# Patient Record
Sex: Male | Born: 1995 | Race: White | Hispanic: No | Marital: Married | Smoking: Current every day smoker
Health system: Southern US, Community
[De-identification: ages and names within clinical notes are randomized; demographics above are authoritative.]

## PROBLEM LIST (undated history)

## (undated) DIAGNOSIS — S43439A Superior glenoid labrum lesion of unspecified shoulder, initial encounter: Secondary | ICD-10-CM

## (undated) HISTORY — PX: WISDOM TOOTH EXTRACTION: SHX21

---
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
## 2020-10-08 ENCOUNTER — Emergency Department

## 2020-10-08 ENCOUNTER — Other Ambulatory Visit: Payer: Self-pay

## 2020-10-08 ENCOUNTER — Emergency Department
Admission: EM | Admit: 2020-10-08 | Discharge: 2020-10-08 | Disposition: A | Discharge status: Home / Self Care | Attending: Emergency Medicine | Admitting: Emergency Medicine

## 2020-10-08 DIAGNOSIS — Z20822 Contact with and (suspected) exposure to covid-19: Secondary | ICD-10-CM | POA: Insufficient documentation

## 2020-10-08 DIAGNOSIS — N50811 Right testicular pain: Secondary | ICD-10-CM

## 2020-10-08 DIAGNOSIS — R109 Unspecified abdominal pain: Secondary | ICD-10-CM | POA: Diagnosis not present

## 2020-10-08 DIAGNOSIS — N5082 Scrotal pain: Secondary | ICD-10-CM | POA: Diagnosis not present

## 2020-10-08 LAB — URINALYSIS, ROUTINE W REFLEX MICROSCOPIC
Bilirubin Urine: NEGATIVE
Glucose, UA: NEGATIVE mg/dL
Hgb urine dipstick: NEGATIVE
Ketones, ur: NEGATIVE mg/dL
Leukocytes,Ua: NEGATIVE
Nitrite: NEGATIVE
Protein, ur: NEGATIVE mg/dL
Specific Gravity, Urine: 1.017 (ref 1.005–1.030)
pH: 7 (ref 5.0–8.0)

## 2020-10-08 LAB — CBC
HCT: 46.1 % (ref 39.0–52.0)
Hemoglobin: 15.6 g/dL (ref 13.0–17.0)
MCH: 29.2 pg (ref 26.0–34.0)
MCHC: 33.8 g/dL (ref 30.0–36.0)
MCV: 86.2 fL (ref 80.0–100.0)
Platelets: 258 10*3/uL (ref 150–400)
RBC: 5.35 MIL/uL (ref 4.22–5.81)
RDW: 12.7 % (ref 11.5–15.5)
WBC: 9.2 10*3/uL (ref 4.0–10.5)
nRBC: 0 % (ref 0.0–0.2)

## 2020-10-08 LAB — COMPREHENSIVE METABOLIC PANEL
ALT: 35 U/L (ref 0–44)
AST: 23 U/L (ref 15–41)
Albumin: 5.1 g/dL — ABNORMAL HIGH (ref 3.5–5.0)
Alkaline Phosphatase: 55 U/L (ref 38–126)
Anion gap: 9 (ref 5–15)
BUN: 10 mg/dL (ref 6–20)
CO2: 28 mmol/L (ref 22–32)
Calcium: 10.4 mg/dL — ABNORMAL HIGH (ref 8.9–10.3)
Chloride: 101 mmol/L (ref 98–111)
Creatinine, Ser: 0.92 mg/dL (ref 0.61–1.24)
GFR, Estimated: >60 mL/min (ref >60)
Glucose, Bld: 94 mg/dL (ref 70–99)
Potassium: 4.1 mmol/L (ref 3.5–5.1)
Sodium: 138 mmol/L (ref 135–145)
Total Bilirubin: 1.2 mg/dL (ref 0.3–1.2)
Total Protein: 7.7 g/dL (ref 6.5–8.1)

## 2020-10-08 LAB — CHLAMYDIA/NGC RT PCR (ARMC ONLY)
Chlamydia Tr: NOT DETECTED
N gonorrhoeae: NOT DETECTED

## 2020-10-08 LAB — RESP PANEL BY RT-PCR (FLU A&B, COVID) ARPGX2
Influenza A by PCR: NEGATIVE
Influenza B by PCR: NEGATIVE
SARS Coronavirus 2 by RT PCR: NEGATIVE

## 2020-10-08 MED ORDER — DOXYCYCLINE MONOHYDRATE 100 MG PO TABS: 100.0000 mg | ORAL_TABLET | Freq: Two times a day (BID) | ORAL | 0 refills | Status: AC

## 2020-10-08 MED ORDER — KETOROLAC TROMETHAMINE 30 MG/ML IJ SOLN
30.0000 mg | Freq: Once | INTRAMUSCULAR | Status: AC
  Administered 2020-10-08: 30 mg via INTRAVENOUS
  Filled 2020-10-08: qty 1

## 2020-10-08 MED ORDER — MORPHINE SULFATE (PF) 4 MG/ML IV SOLN
4.0000 mg | Freq: Once | INTRAVENOUS | Status: AC
  Administered 2020-10-08: 4 mg via INTRAVENOUS
  Filled 2020-10-08: qty 1

## 2020-10-08 MED ORDER — ONDANSETRON HCL 4 MG/2ML IJ SOLN
4.0000 mg | Freq: Once | INTRAMUSCULAR | Status: AC
  Administered 2020-10-08: 4 mg via INTRAVENOUS
  Filled 2020-10-08: qty 2

## 2020-10-08 MED ORDER — KETOROLAC TROMETHAMINE 10 MG PO TABS: 10.0000 mg | ORAL_TABLET | Freq: Four times a day (QID) | ORAL | 0 refills | Status: AC | PRN

## 2020-10-08 MED ORDER — DOXYCYCLINE MONOHYDRATE 100 MG PO TABS: 100.0000 mg | ORAL_TABLET | Freq: Two times a day (BID) | ORAL | 0 refills | Status: DC

## 2020-10-08 MED ORDER — IOHEXOL 350 MG/ML SOLN
80.0000 mL | Freq: Once | INTRAVENOUS | Status: AC | PRN
  Administered 2020-10-08: 80 mL via INTRAVENOUS
  Filled 2020-10-08: qty 80

## 2020-10-08 MED ORDER — CEFTRIAXONE SODIUM 1 G IJ SOLR
500.0000 mg | Freq: Once | INTRAMUSCULAR | Status: AC
  Administered 2020-10-08: 500 mg via INTRAMUSCULAR
  Filled 2020-10-08: qty 10

## 2020-10-08 MED ORDER — SODIUM CHLORIDE 0.9 % IV BOLUS
500.0000 mL | Freq: Once | INTRAVENOUS | Status: AC
Start: 2020-10-08 — End: 2020-10-08
  Administered 2020-10-08: 500 mL via INTRAVENOUS

## 2020-10-08 MED ORDER — LIDOCAINE HCL (PF) 1 % IJ SOLN
INTRAMUSCULAR | Status: AC
  Filled 2020-10-08: qty 5

## 2020-10-08 NOTE — ED Notes (Signed)
Patient transported to Ultrasound 

## 2020-10-08 NOTE — ED Triage Notes (Signed)
Pt presets to ED with c/o of R sided testicle pain that started yesterday at noon. Pt denies trauma or injury to this area. Pt denies HX of kidney stones or pain starting in flank area. Pt denies burning on urination. Pt states "it feels swollen on the inside".

## 2020-10-08 NOTE — Discharge Instructions (Addendum)
Take Doxycyline twice daily for ten days.  Wear loose clothing at home. You can take Toradol up to 4 times daily for the next 5 days. Please take doxycycline twice daily for the next 10 days. If pain does not progressively improve in 1 week, please make follow-up appointment with urology.

## 2020-10-08 NOTE — ED Provider Notes (Signed)
[facility]-EMERGENCY DEPARTMENT  ____________________________________________  Time seen: Approximately 4:16 PM  I have reviewed the triage vital signs and the nursing notes.   HISTORY  Chief Complaint Testicle Pain   Historian Patient     HPI Alexander Gallagher is a 25 y.o. male presents to the emergency department with right-sided scrotal pain and swelling that started acutely.  Patient reports that he was at work and was lifting heavy bags when he noted a pulling sensation in his groin.  Patient states that he did have a similar tearing sensation approximately 1 year ago when he was moving to pick up his child from the couch but states that he did not have swelling or pain at that time.  He denies nausea or vomiting.  He states that his urine has appeared cloudy to him which is atypical but denies dysuria, hematuria or low back pain.  He denies falls or mechanisms of trauma.  No prior history of torsion or scrotal abnormalities.  He does endorse some suprapubic discomfort.  No chest pain, chest tightness or shortness of breath.   History reviewed. No pertinent past medical history.   Immunizations up to date:  Yes.     History reviewed. No pertinent past medical history.  There are no problems to display for this patient.   History reviewed. No pertinent surgical history.  Prior to Admission medications   Medication Sig Start Date End Date Taking? Authorizing Provider  ketorolac (TORADOL) 10 MG tablet Take 1 tablet (10 mg total) by mouth every 6 (six) hours as needed for up to 5 days. 10/08/20 10/13/20 Yes Pia Mau M, PA-C  doxycycline (ADOXA) 100 MG tablet Take 1 tablet (100 mg total) by mouth 2 (two) times daily for 10 days. 10/08/20 10/18/20  Orvil Feil, PA-C    Allergies Patient has no known allergies.  History reviewed. No pertinent family history.  Social History     Review of Systems  Constitutional: No fever/chills Eyes:  No discharge ENT: No upper  respiratory complaints. Respiratory: no cough. No SOB/ use of accessory muscles to breath Gastrointestinal:   No nausea, no vomiting.  No diarrhea.  No constipation. Genitourinary: Patient has scrotal pain.  Musculoskeletal: Negative for musculoskeletal pain. Skin: Negative for rash, abrasions, lacerations, ecchymosis.    ____________________________________________   PHYSICAL EXAM:  VITAL SIGNS: ED Triage Vitals  Enc Vitals Group     BP 10/08/20 1539 133/75     Pulse Rate 10/08/20 1539 87     Resp 10/08/20 1539 16     Temp 10/08/20 1539 98.5 F (36.9 C)     Temp Source 10/08/20 1539 Oral     SpO2 10/08/20 1539 100 %     Weight 10/08/20 1540 198 lb (89.8 kg)     Height 10/08/20 1540 5\' 11"  (1.803 m)     Head Circumference --      Peak Flow --      Pain Score 10/08/20 1540 10     Pain Loc --      Pain Edu? --      Excl. in GC? --      Constitutional: Alert and oriented. Well appearing and in no acute distress. Eyes: Conjunctivae are normal. PERRL. EOMI. Head: Atraumatic. ENT:       Nose: No congestion/rhinnorhea.      Mouth/Throat: Mucous membranes are moist.  Neck: No stridor.  No cervical spine tenderness to palpation.  Cardiovascular: Normal rate, regular rhythm. Normal S1 and S2.  Good peripheral circulation.  Respiratory: Normal respiratory effort without tachypnea or retractions. Lungs CTAB. Good air entry to the bases with no decreased or absent breath sounds Gastrointestinal: Bowel sounds x 4 quadrants. Soft and nontender to palpation. No guarding or rigidity. No distention. Musculoskeletal: Full range of motion to all extremities. No obvious deformities noted Neurologic:  Normal for age. No gross focal neurologic deficits are appreciated.  Skin:  Skin is warm, dry and intact. No rash noted. Psychiatric: Mood and affect are normal for age. Speech and behavior are normal.   ____________________________________________   LABS (all labs ordered are listed, but  only abnormal results are displayed)  Labs Reviewed  URINALYSIS, ROUTINE W REFLEX MICROSCOPIC - Abnormal; Notable for the following components:      Result Value   Color, Urine YELLOW (*)    APPearance CLOUDY (*)    All other components within normal limits  COMPREHENSIVE METABOLIC PANEL - Abnormal; Notable for the following components:   Calcium 10.4 (*)    Albumin 5.1 (*)    All other components within normal limits  CHLAMYDIA/NGC RT PCR ([facility] ONLY)            RESP PANEL BY RT-PCR (FLU A&B, COVID) ARPGX2  CBC   ____________________________________________  EKG   ____________________________________________  RADIOLOGY Geraldo Pitter, personally viewed and evaluated these images (plain radiographs) as part of my medical decision making, as well as reviewing the written report by the radiologist.    CT ABDOMEN PELVIS W CONTRAST  Result Date: 10/08/2020 CLINICAL DATA:  Right-sided testicular pain. EXAM: CT ABDOMEN AND PELVIS WITH CONTRAST TECHNIQUE: Multidetector CT imaging of the abdomen and pelvis was performed using the standard protocol following bolus administration of intravenous contrast. CONTRAST:  41mL OMNIPAQUE IOHEXOL 350 MG/ML SOLN COMPARISON:  None. FINDINGS: Lower chest: No acute abnormality. Hepatobiliary: No focal liver abnormality is seen. No gallstones, gallbladder wall thickening, or biliary dilatation. Pancreas: Unremarkable. No pancreatic ductal dilatation or surrounding inflammatory changes. Spleen: Normal in size without focal abnormality. Adrenals/Urinary Tract: Adrenal glands are unremarkable. Kidneys are normal, without renal calculi, focal lesion, or hydronephrosis. Bladder is unremarkable. Stomach/Bowel: Stomach is within normal limits. Appendix appears normal. No evidence of bowel wall thickening, distention, or inflammatory changes. Vascular/Lymphatic: No significant vascular findings are present. No enlarged abdominal or pelvic lymph nodes. Reproductive:  Prostate is unremarkable. Other: No abdominal wall hernia or abnormality. No abdominopelvic ascites. Musculoskeletal: No acute or significant osseous findings. IMPRESSION: No CT evidence of acute intra-abdominal pathology. Electronically Signed   By: Aram Candela M.D.   On: 10/08/2020 17:33   US SCROTUM W/DOPPLER  Result Date: 10/08/2020 CLINICAL DATA:  Right testicle pain since yesterday. EXAM: SCROTAL ULTRASOUND DOPPLER ULTRASOUND OF THE TESTICLES TECHNIQUE: Complete ultrasound examination of the testicles, epididymis, and other scrotal structures was performed. Color and spectral Doppler ultrasound were also utilized to evaluate blood flow to the testicles. COMPARISON:  None. FINDINGS: Right testicle Measurements: 5.2 x 2.3 x 2.9 cm. No mass or microlithiasis visualized. Left testicle Measurements: 4.8 x 1 x 2 cm. No mass or microlithiasis visualized. Right epididymis:  Normal in size and appearance. Left epididymis:  Normal in size and appearance. Hydrocele:  None visualized. Varicocele:  None visualized. Pulsed Doppler interrogation of both testes demonstrates normal low resistance arterial and venous waveforms bilaterally. IMPRESSION: Unremarkable bilateral scrotal ultrasound. Electronically Signed   By: Tish Frederickson M.D.   On: 10/08/2020 17:08    ____________________________________________    PROCEDURES  Procedure(s) performed:     Procedures  Medications  morphine 4 MG/ML injection 4 mg (4 mg Intravenous Given 10/08/20 1622)  ondansetron (ZOFRAN) injection 4 mg (4 mg Intravenous Given 10/08/20 1621)  sodium chloride 0.9 % bolus 500 mL (0 mLs Intravenous Stopped 10/08/20 1854)  iohexol (OMNIPAQUE) 350 MG/ML injection 80 mL (80 mLs Intravenous Contrast Given 10/08/20 1654)  ketorolac (TORADOL) 30 MG/ML injection 30 mg (30 mg Intravenous Given 10/08/20 1846)  cefTRIAXone (ROCEPHIN) injection 500 mg (500 mg Intramuscular Given 10/08/20 1855)  lidocaine (PF) (XYLOCAINE) 1 %  injection (  Given 10/08/20 1855)     ____________________________________________   INITIAL IMPRESSION / ASSESSMENT AND PLAN / ED COURSE  Pertinent labs & imaging results that were available during my care of the patient were reviewed by me and considered in my medical decision making (see chart for details).      Assessment and plan Scrotal pain 25 year old male presents to the emergency department with acute right-sided scrotal pain and swelling without overlying erythema or nausea and vomiting that started today after an episode of heavy lifting.  Vital signs were reassuring at triage.  On physical exam, patient seemed to be in intense pain.  He reported that scrotal pain was worse with ambulation and relieved with rest.  Differential diagnosis at this time includes hernia, testicular torsion, scrotal abscess, epididymitis, hydrocele....  Will obtain scrotal ultrasound and dedicated CT abdomen pelvis along with basic labs and urinalysis and will reassess...  Morphine and supplemental fluids were given in the emergency department for pain  Scrotal ultrasound showed no evidence of torsion or scrotal abscess.  No evidence of incarcerated hernia on CT abdomen and pelvis or other acute intra-abdominal abnormality.  We cover patient for early epididymitis with Rocephin and doxycycline and will give injection of Toradol for pain and oral Toradol at home.  Patient was advised to follow-up with urology if symptoms persist.  All patient questions were answered.     ____________________________________________  FINAL CLINICAL IMPRESSION(S) / ED DIAGNOSES  Final diagnoses:  Scrotal pain      NEW MEDICATIONS STARTED DURING THIS VISIT:  ED Discharge Orders          Ordered    doxycycline (ADOXA) 100 MG tablet  2 times daily,   Status:  Discontinued        10/08/20 1810    ketorolac (TORADOL) 10 MG tablet  Every 6 hours PRN        10/08/20 1810    doxycycline (ADOXA) 100 MG  tablet  2 times daily        10/08/20 1811                This chart was dictated using voice recognition software/Dragon. Despite best efforts to proofread, errors can occur which can change the meaning. Any change was purely unintentional.     Orvil Feil, PA-C 10/08/20 1948    Delton [place], MD 10/25/20 [phone]

## 2020-10-10 ENCOUNTER — Other Ambulatory Visit: Payer: Self-pay

## 2020-10-10 ENCOUNTER — Emergency Department
Admission: EM | Admit: 2020-10-10 | Discharge: 2020-10-10 | Disposition: A | Discharge status: Home / Self Care | Attending: Emergency Medicine | Admitting: Emergency Medicine

## 2020-10-10 ENCOUNTER — Emergency Department

## 2020-10-10 DIAGNOSIS — N453 Epididymo-orchitis: Secondary | ICD-10-CM | POA: Diagnosis not present

## 2020-10-10 DIAGNOSIS — N50811 Right testicular pain: Secondary | ICD-10-CM | POA: Diagnosis present

## 2020-10-10 LAB — CHLAMYDIA/NGC RT PCR (ARMC ONLY)
Chlamydia Tr: NOT DETECTED
N gonorrhoeae: NOT DETECTED

## 2020-10-10 LAB — BASIC METABOLIC PANEL
Anion gap: 10 (ref 5–15)
BUN: 15 mg/dL (ref 6–20)
CO2: 26 mmol/L (ref 22–32)
Calcium: 9.7 mg/dL (ref 8.9–10.3)
Chloride: 103 mmol/L (ref 98–111)
Creatinine, Ser: 1.26 mg/dL — ABNORMAL HIGH (ref 0.61–1.24)
GFR, Estimated: >60 mL/min (ref >60)
Glucose, Bld: 100 mg/dL — ABNORMAL HIGH (ref 70–99)
Potassium: 4.2 mmol/L (ref 3.5–5.1)
Sodium: 139 mmol/L (ref 135–145)

## 2020-10-10 LAB — CBC
HCT: 43.5 % (ref 39.0–52.0)
Hemoglobin: 15 g/dL (ref 13.0–17.0)
MCH: 29.9 pg (ref 26.0–34.0)
MCHC: 34.5 g/dL (ref 30.0–36.0)
MCV: 86.7 fL (ref 80.0–100.0)
Platelets: 225 10*3/uL (ref 150–400)
RBC: 5.02 MIL/uL (ref 4.22–5.81)
RDW: 12.6 % (ref 11.5–15.5)
WBC: 7.7 10*3/uL (ref 4.0–10.5)
nRBC: 0 % (ref 0.0–0.2)

## 2020-10-10 LAB — URINALYSIS, ROUTINE W REFLEX MICROSCOPIC
Bilirubin Urine: NEGATIVE
Glucose, UA: NEGATIVE mg/dL
Hgb urine dipstick: NEGATIVE
Ketones, ur: 5 mg/dL — AB
Leukocytes,Ua: NEGATIVE
Nitrite: NEGATIVE
Protein, ur: NEGATIVE mg/dL
Specific Gravity, Urine: 1.019 (ref 1.005–1.030)
pH: 6 (ref 5.0–8.0)

## 2020-10-10 MED ORDER — OXYCODONE HCL 5 MG PO TABS
5.0000 mg | ORAL_TABLET | Freq: Once | ORAL | Status: AC
Start: 2020-10-10 — End: 2020-10-10
  Administered 2020-10-10: 5 mg via ORAL
  Filled 2020-10-10: qty 1

## 2020-10-10 MED ORDER — KETOROLAC TROMETHAMINE 30 MG/ML IJ SOLN
15.0000 mg | Freq: Once | INTRAMUSCULAR | Status: AC
  Administered 2020-10-10: 15 mg via INTRAVENOUS
  Filled 2020-10-10: qty 1

## 2020-10-10 MED ORDER — ONDANSETRON HCL 4 MG/2ML IJ SOLN
4.0000 mg | Freq: Once | INTRAMUSCULAR | Status: AC
  Administered 2020-10-10: 4 mg via INTRAVENOUS
  Filled 2020-10-10: qty 2

## 2020-10-10 MED ORDER — OXYCODONE HCL 5 MG PO TABS: 5.0000 mg | ORAL_TABLET | Freq: Four times a day (QID) | ORAL | 0 refills | Status: AC | PRN

## 2020-10-10 MED ORDER — HYDROMORPHONE HCL 1 MG/ML IJ SOLN
0.5000 mg | Freq: Once | INTRAMUSCULAR | Status: AC
  Administered 2020-10-10: 0.5 mg via INTRAVENOUS
  Filled 2020-10-10: qty 1

## 2020-10-10 MED ORDER — SODIUM CHLORIDE 0.9 % IV BOLUS
1000.0000 mL | Freq: Once | INTRAVENOUS | Status: AC
  Administered 2020-10-10: 1000 mL via INTRAVENOUS

## 2020-10-10 NOTE — Discharge Instructions (Addendum)
I talked to urology Dr. Apolinar Junes and hopefully one of their partners can see you tomorrow or she can see you on Tuesday.  Take Tylenol 1 g every 8 hours, the Toradol as prescribed and the oxycodone for breakthrough pain.  You can take this every 4-6 hours to help with pain.  Do not drive or work while on this.  Urology is going to try to set you up with an appointment either tomorrow or on Tuesday.  If you not hear from them tomorrow you can call them.  Return to the ER for fevers, redness, changes in the skin color or any other concerns   IMPRESSION: 1. Heterogeneous, hypervascular right testicle and epididymis with a small right hydrocele, findings consistent with right epididymo-orchitis. 2. The left testicle is normal in appearance.  Take oxycodone as prescribed. Do not drink alcohol, drive or participate in any other potentially dangerous activities while taking this medication as it may make you sleepy. Do not take this medication with any other sedating medications, either prescription or over-the-counter. If you were prescribed Percocet or Vicodin, do not take these with acetaminophen (Tylenol) as it is already contained within these medications.  This medication is an opiate (or narcotic) pain medication and can be habit forming. Use it as little as possible to achieve adequate pain control. Do not use or use it with extreme caution if you have a history of opiate abuse or dependence. If you are on a pain contract with your primary care doctor or a pain specialist, be sure to let them know you were prescribed this medication today from the Emergency Department. This medication is intended for your use only - do not give any to anyone else and keep it in a secure place where nobody else, especially children, have access to it.

## 2020-10-10 NOTE — ED Notes (Signed)
Patient given discharge instructions, all questions answered. Patient in possession of all belongings, directed to the discharge area  

## 2020-10-10 NOTE — ED Triage Notes (Signed)
Pt comes pov with testicle pain. Started antibiotics and pain meds Friday but states they aren't helping and his right testicle is now much larger. Had [place] and bloodwork done Friday.

## 2020-10-10 NOTE — ED Provider Notes (Signed)
[facility] Emergency Department Provider Note  ____________________________________________   Event Date/Time   First MD Initiated Contact with Patient 10/10/20 1145     (approximate)  I have reviewed the triage vital signs and the nursing notes.   HISTORY  Chief Complaint Testicle Pain    HPI Alexander Gallagher is a 25 y.o. male who is otherwise healthy who comes in with concerns for testicle pain.  Patient reports that he was started on antibiotics and pain meds on Friday.  He has taken a total of 4 doses of the doxycycline and denies any improvement with the Toradol at home.  He states that initially there was no swelling or redness of the testicles when he came in on Friday.  He states that on Thursday he felt like he pulled something in his groin and as if he had torn something.  However Saturday he started developing swelling in the right testicle that has been gradually getting worse and now having more significant pain that is severe, constant, radiating into his lower abdomen and into his back.  Denies this ever happening previously.  Patient is sexually active with women only.  He is married.    On review of records patient was seen on Friday.  At that time his urine was negative for UTI and he had a CT scan done that was negative and ultrasound that was negative.  Patient was covered for epididymitis with Rocephin and doxycycline and recommend to follow-up with urology.   History reviewed. No pertinent past medical history.  There are no problems to display for this patient.   History reviewed. No pertinent surgical history.  Prior to Admission medications   Medication Sig Start Date End Date Taking? Authorizing Provider  doxycycline (ADOXA) 100 MG tablet Take 1 tablet (100 mg total) by mouth 2 (two) times daily for 10 days. 10/08/20 10/18/20  Orvil Feil, PA-C  ketorolac (TORADOL) 10 MG tablet Take 1 tablet (10 mg total) by mouth every 6 (six)  hours as needed for up to 5 days. 10/08/20 10/13/20  Orvil Feil, PA-C    Allergies Patient has no known allergies.  History reviewed. No pertinent family history.  Social History Married, no drugs    Review of Systems Constitutional: No fever/chills Eyes: No visual changes. ENT: No sore throat. Cardiovascular: Denies chest pain. Respiratory: Denies shortness of breath. Gastrointestinal: Abdominal pain no nausea, no vomiting.  No diarrhea.  No constipation. Genitourinary: Testicle pain Musculoskeletal: Back pain Skin: Negative for rash. Neurological: Negative for headaches, focal weakness or numbness. All other ROS negative ____________________________________________   PHYSICAL EXAM:  VITAL SIGNS: ED Triage Vitals [10/10/20 1113]  Enc Vitals Group     BP 128/74     Pulse Rate 97     Resp 18     Temp 99.6 F (37.6 C)     Temp Source Oral     SpO2 98 %     Weight 196 lb 3.4 oz (89 kg)     Height 5\' 11"  (1.803 m)     Head Circumference      Peak Flow      Pain Score 8     Pain Loc      Pain Edu?      Excl. in GC?     Constitutional: Alert and oriented. Well appearing and in no acute distress. Eyes: Conjunctivae are normal. EOMI. Head: Atraumatic. Nose: No congestion/rhinnorhea. Mouth/Throat: Mucous membranes are moist.   Neck: No stridor.  Trachea Midline. FROM Cardiovascular: Normal rate, regular rhythm. Grossly normal heart sounds.  Good peripheral circulation. Respiratory: Normal respiratory effort.  No retractions. Lungs CTAB. Gastrointestinal: Soft and nontender. No distention. No abdominal bruits.  Musculoskeletal: No lower extremity tenderness nor edema.  No joint effusions. Neurologic:  Normal speech and language. No gross focal neurologic deficits are appreciated.  Skin:  Skin is warm, dry and intact. No rash noted. Psychiatric: Mood and affect are normal. Speech and behavior are normal. GU: Right testicle is  enlarged compared to the left  testicle with significant tenderness on the right testicle mostly on the back of testicle.  Circumcised penis without any drainage.  ____________________________________________   LABS (all labs ordered are listed, but only abnormal results are displayed)  Labs Reviewed  CBC  BASIC METABOLIC PANEL   ____________________________________________  RADIOLOGY   Official radiology report(s): US SCROTUM W/DOPPLER  Result Date: 10/10/2020 CLINICAL DATA:  Right testicular pain and swelling EXAM: SCROTAL ULTRASOUND DOPPLER ULTRASOUND OF THE TESTICLES TECHNIQUE: Complete ultrasound examination of the testicles, epididymis, and other scrotal structures was performed. Color and spectral Doppler ultrasound were also utilized to evaluate blood flow to the testicles. COMPARISON:  None. FINDINGS: Right testicle Measurements: 5.3 x 3.2 x 2.9 cm. No mass or microlithiasis visualized. Heterogeneous and hypervascular testicle. Left testicle Measurements: 5.5 x 2.5 x 3.3 cm. No mass or microlithiasis visualized. Right epididymis:  Enlarged and hypervascular. Left epididymis:  Normal in size and appearance. Hydrocele:  Small right hydrocele. Varicocele:  None visualized. Pulsed Doppler interrogation of both testes demonstrates normal low resistance arterial and venous waveforms bilaterally. IMPRESSION: 1. Heterogeneous, hypervascular right testicle and epididymis with a small right hydrocele, findings consistent with right epididymo-orchitis. 2. The left testicle is normal in appearance. Electronically Signed   By: Lauralyn Primes M.D.   On: 10/10/2020 13:09    ____________________________________________   PROCEDURES  Procedure(s) performed (including Critical Care):  Procedures   ____________________________________________   INITIAL IMPRESSION / ASSESSMENT AND PLAN / ED COURSE  Alexander Gallagher was evaluated in Emergency Department on 10/10/2020 for the symptoms described in the history of present  illness. He was evaluated in the context of the global COVID-19 pandemic, which necessitated consideration that the patient might be at risk for infection with the SARS-CoV-2 virus that causes COVID-19. Institutional protocols and algorithms that pertain to the evaluation of patients at risk for COVID-19 are in a state of rapid change based on information released by regulatory bodies including the CDC and federal and state organizations. These policies and algorithms were followed during the patient's care in the ED.    Patient comes in for repeat visit for right testicle pain with reassuring work-up a few days ago but his physical exam is changing with now a swollen right testicle.  Previously he had a normal physical exam.  I wonder if this could be like a hematoma or some blood given he does report pulling his groin.  However I do feel we need to get a repeat ultrasound to make sure no evidence of torsion.  Patient is already been treated for epididymitis I do not see any redness or erythema to suggest a worsening scrotal cellulitis or abscess or foreign's gangrene   Patient has some low-grade temperatures but has not taken any fever reducers.  White count is normal does not meet sepsis criteria.  On repeat check of his temperature by myself he was 98.6 without any fever reducers.  Ultrasound head shows concerns for right epididymoorchitis.  Will  discuss with urology given patient is already on the doxycycline to see there is anything else that they would recommend   Discussed case with Dr. Apolinar Junes from urology.  They recommended continued treatment with the doxycycline given a few doses probably not enough.  This could just be more inflammation rather than an infection but so would treat with doxycycline, Toradol, pain medication and he can follow-up in their clinic on Monday or Tuesday.  At this time do not feel like a repeat CT scan is indicated.  He already had a CT scan that does not show any  signs of kidney stones or appendicitis and he has obvious changes to his right testicle so I suspect that all of his pain is just referred in nature.  I discussed the provisional nature of ED diagnosis, the treatment so far, the ongoing plan of care, follow up appointments and return precautions with the patient and any family or support people present. They expressed understanding and agreed with the plan, discharged home.             ____________________________________________   FINAL CLINICAL IMPRESSION(S) / ED DIAGNOSES   Final diagnoses:  Pain in right testicle  Orchitis and epididymitis      MEDICATIONS GIVEN DURING THIS VISIT:  Medications  oxyCODONE (Oxy IR/ROXICODONE) immediate release tablet 5 mg (has no administration in time range)  ketorolac (TORADOL) 30 MG/ML injection 15 mg (has no administration in time range)  HYDROmorphone (DILAUDID) injection 0.5 mg (0.5 mg Intravenous Given 10/10/20 1236)  ondansetron (ZOFRAN) injection 4 mg (4 mg Intravenous Given 10/10/20 1235)  sodium chloride 0.9 % bolus 1,000 mL (1,000 mLs Intravenous New Bag/Given 10/10/20 1235)     ED Discharge Orders          Ordered    oxyCODONE (ROXICODONE) 5 MG immediate release tablet  Every 6 hours PRN        10/10/20 1349             Note:  This document was prepared using Dragon voice recognition software and may include unintentional dictation errors.    Concha Se, MD 10/10/20 1351

## 2020-10-11 LAB — URINE CULTURE: Culture: NO GROWTH

## 2020-10-11 NOTE — Progress Notes (Signed)
10/12/20 1:19 PM   Keevin Panebianco 1995-06-11 [id number]  Referring provider:  No referring provider defined for this encounter. Chief Complaint  Patient presents with   Testicle Pain     HPI: Alexander Gallagher is a 25 y.o.male who presents today for further evaluation of scrotal pain.   He was seen in the ED on two occasions.  On 10/08/2020 he presented to the ED with right-sided scrotal pain and swelling, his pain initially started on 10/07/2020 it started acutely. Scrotal ultrasound revealed unremarkable bilateral scrotal ultrasound. CT of abdomen and pelvis revealed no CT evidence of acute intra-abdominal pathology. Urinalysis was unremarkable. In the ER he was treat with ceftriaxone and given oral medication for doxycycline   He was seen again in the ED again on 10/10/2020 for testicle pain. Scrotal ultrasound revealed heterogenous, hypervascular right testicle and epididymis with a small right hydrocele, findings were consistent with right epididymo-orchitis. The left testicle is normal in appearence. Urinalysis was unremarkable besides 5 ketones. He also had chlamydia and gonorrhea testing which was negative   He reports that his symptoms started when he was lifting. He has no burning or urinary discharge. He reports his urine looked as if it had sperm in it.   He reports that his symptoms have been stable. He feels tenderness in his lower abdomen.He also reports that he has not been able to have a bowel movement.  He had experienced these symptoms previously last year during October but it went away.   He denies any fever or chills.    PMH: No past medical history on file.  Surgical History: No past surgical history on file.  Home Medications:  Allergies as of 10/12/2020   No Known Allergies      Medication List        Accurate as of October 12, 2020  1:19 PM. If you have any questions, ask your nurse or doctor.          doxycycline 100 MG  tablet Commonly known as: ADOXA Take 1 tablet (100 mg total) by mouth 2 (two) times daily for 10 days.   ketorolac 10 MG tablet Commonly known as: TORADOL Take 1 tablet (10 mg total) by mouth every 6 (six) hours as needed for up to 5 days.   oxyCODONE 5 MG immediate release tablet Commonly known as: Roxicodone Take 1 tablet (5 mg total) by mouth every 6 (six) hours as needed for up to 5 days.        Allergies: No Known Allergies  Family History: No family history on file.  Social History:  has no history on file for tobacco use, alcohol use, and drug use.   Physical Exam: BP 121/78   Pulse 76   Ht 6' (1.829 m)   Wt 198 lb (89.8 kg)   BMI 26.85 kg/m   Constitutional:  Alert and oriented, No acute distress. HEENT: [place] AT, moist mucus membranes.  Trachea midline, no masses. Cardiovascular: No clubbing, cyanosis, or edema. Respiratory: Normal respiratory effort, no increased work of breathing. GU: right testicle moderately enlarged/ tender with normal right inguinal region, no overlying skin changes or scrotal edema/ erythema. Left testicle normal  Skin: No rashes, bruises or suspicious lesions. Neurologic: Grossly intact, no focal deficits, moving all 4 extremities. Psychiatric: Normal mood and affect.  Laboratory Data:  Lab Results  Component Value Date   CREATININE 1.26 (H) 10/10/2020     Pertinent Imaging: CLINICAL DATA:  Right testicle pain since yesterday.   EXAM:  SCROTAL ULTRASOUND   DOPPLER ULTRASOUND OF THE TESTICLES   TECHNIQUE: Complete ultrasound examination of the testicles, epididymis, and other scrotal structures was performed. Color and spectral Doppler ultrasound were also utilized to evaluate blood flow to the testicles.   COMPARISON:  None.   FINDINGS: Right testicle   Measurements: 5.2 x 2.3 x 2.9 cm. No mass or microlithiasis visualized.   Left testicle   Measurements: 4.8 x 1 x 2 cm. No mass or microlithiasis visualized.    Right epididymis:  Normal in size and appearance.   Left epididymis:  Normal in size and appearance.   Hydrocele:  None visualized.   Varicocele:  None visualized.   Pulsed Doppler interrogation of both testes demonstrates normal low resistance arterial and venous waveforms bilaterally.   IMPRESSION: Unremarkable bilateral scrotal ultrasound.     Electronically Signed   By: Tish Frederickson M.D.   On: 10/08/2020 17:08  CLINICAL DATA:  Right-sided testicular pain.   EXAM: CT ABDOMEN AND PELVIS WITH CONTRAST   TECHNIQUE: Multidetector CT imaging of the abdomen and pelvis was performed using the standard protocol following bolus administration of intravenous contrast.   CONTRAST:  50mL OMNIPAQUE IOHEXOL 350 MG/ML SOLN   COMPARISON:  None.   FINDINGS: Lower chest: No acute abnormality.   Hepatobiliary: No focal liver abnormality is seen. No gallstones, gallbladder wall thickening, or biliary dilatation.   Pancreas: Unremarkable. No pancreatic ductal dilatation or surrounding inflammatory changes.   Spleen: Normal in size without focal abnormality.   Adrenals/Urinary Tract: Adrenal glands are unremarkable. Kidneys are normal, without renal calculi, focal lesion, or hydronephrosis. Bladder is unremarkable.   Stomach/Bowel: Stomach is within normal limits. Appendix appears normal. No evidence of bowel wall thickening, distention, or inflammatory changes.   Vascular/Lymphatic: No significant vascular findings are present. No enlarged abdominal or pelvic lymph nodes.   Reproductive: Prostate is unremarkable.   Other: No abdominal wall hernia or abnormality. No abdominopelvic ascites.   Musculoskeletal: No acute or significant osseous findings.   IMPRESSION: No CT evidence of acute intra-abdominal pathology.     Electronically Signed   By: Aram Candela M.D.   On: 10/08/2020 17:33  CLINICAL DATA:  Right testicle pain since yesterday.   EXAM: SCROTAL  ULTRASOUND   DOPPLER ULTRASOUND OF THE TESTICLES   TECHNIQUE: Complete ultrasound examination of the testicles, epididymis, and other scrotal structures was performed. Color and spectral Doppler ultrasound were also utilized to evaluate blood flow to the testicles.   COMPARISON:  None.   FINDINGS: Right testicle   Measurements: 5.2 x 2.3 x 2.9 cm. No mass or microlithiasis visualized.   Left testicle   Measurements: 4.8 x 1 x 2 cm. No mass or microlithiasis visualized.   Right epididymis:  Normal in size and appearance.   Left epididymis:  Normal in size and appearance.   Hydrocele:  None visualized.   Varicocele:  None visualized.   Pulsed Doppler interrogation of both testes demonstrates normal low resistance arterial and venous waveforms bilaterally.   IMPRESSION: Unremarkable bilateral scrotal ultrasound.     Electronically Signed   By: Tish Frederickson M.D.   On: 10/08/2020 17:08   I have personally reviewed the images and agree with radiologist interpretation.    Assessment & Plan:    Right epididymo-orchitis  -No evidence of abdominal pathology, suspect abdominal pain is referred  -In absence of negative UA/chlamydia/gonorrhea, suspect this may be inflammatory versus viral orchitis and as such, recommend supportive care as outlined below which was discussed  at length.  -Would recommend continuation/completion of doxycycline course as a precaution  - Counseled him in conservative management; tight underwear and elevating testicles, ice. If symptoms fail to improve follow-up   - Take over-the-counter medicine for constipation   -  He was given a work note today   Return later this week if fails to improve  [employer] as a scribe for Vanna Scotland, MD.,have documented all relevant documentation on the behalf of Vanna Scotland, MD,as directed by  Vanna Scotland, MD while in the presence of Vanna Scotland, MD.   [facility] [address], [address] [place], [place] [address] [phone]

## 2020-10-12 ENCOUNTER — Other Ambulatory Visit: Payer: Self-pay

## 2020-10-12 ENCOUNTER — Ambulatory Visit (INDEPENDENT_AMBULATORY_CARE_PROVIDER_SITE_OTHER): Admitting: Urology

## 2020-10-12 VITALS — BP 121/78 | HR 76 | Ht 72.0 in | Wt 198.0 lb

## 2020-10-12 DIAGNOSIS — N453 Epididymo-orchitis: Secondary | ICD-10-CM | POA: Diagnosis not present

## 2020-10-12 NOTE — Patient Instructions (Signed)
Scrotal support, Ice and continue ketorolac. If symptoms fail to improve by later this week call or send my chart message.  Take over the counter medication for constipation such as miralax.

## 2021-04-05 ENCOUNTER — Ambulatory Visit: Admitting: Internal Medicine

## 2021-04-20 ENCOUNTER — Encounter: Payer: Self-pay | Admitting: Internal Medicine

## 2021-04-20 ENCOUNTER — Ambulatory Visit (INDEPENDENT_AMBULATORY_CARE_PROVIDER_SITE_OTHER): Admitting: Internal Medicine

## 2021-04-20 VITALS — BP 106/70 | HR 57 | Temp 98.1°F | Ht 71.0 in | Wt 195.2 lb

## 2021-04-20 DIAGNOSIS — Z0000 Encounter for general adult medical examination without abnormal findings: Secondary | ICD-10-CM | POA: Diagnosis not present

## 2021-04-20 NOTE — Patient Instructions (Signed)
Welcome!  I enjoyed meeting you today! ? ?Please forward any labs / xrays done in the last 5 years and I will upload them to your chart  ? ?Let me know if the sleep issues get worse and you need help ? ? ?
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
## 2021-04-20 NOTE — Assessment & Plan Note (Addendum)
age appropriate education and counseling updated, referrals for preventative services and immunizations addressed, dietary and smoking counseling addressed, most recent labs reviewed.  I have personally reviewed and have noted: ?  ?1) the patient's medical and social history ?2) The pt's use of alcohol, tobacco, and illicit drugs ?3) The patient's current medications and supplements ?4) Functional ability including ADL's, fall risk, home safety risk, hearing and visual impairment ?5) Diet and physical activities ?6) Evidence for depression or mood disorder ?7) The patient's height, weight, and BMI have been recorded in the chart ?I have made referrals, and provided counseling and education based on review of the above.  (None needed;  Record of recent labs requested) ?

## 2021-08-30 ENCOUNTER — Ambulatory Visit (INDEPENDENT_AMBULATORY_CARE_PROVIDER_SITE_OTHER): Admitting: Nurse Practitioner

## 2021-08-30 ENCOUNTER — Encounter: Payer: Self-pay | Admitting: Nurse Practitioner

## 2021-08-30 VITALS — BP 108/64 | HR 67 | Temp 97.6°F | Resp 14 | Ht 71.0 in | Wt 193.4 lb

## 2021-08-30 DIAGNOSIS — T148XXA Other injury of unspecified body region, initial encounter: Secondary | ICD-10-CM

## 2021-08-30 MED ORDER — CYCLOBENZAPRINE HCL 5 MG PO TABS: 5.0000 mg | ORAL_TABLET | Freq: Two times a day (BID) | ORAL | 0 refills | Status: DC | PRN

## 2021-08-30 NOTE — Progress Notes (Signed)
Acute Office Visit  Subjective:     Patient ID: Alexander Gallagher, male    DOB: [birth year], 26 y.o.   MRN: [id number]  Chief Complaint  Patient presents with   Neck Pain    Sx started on 08/23/21, pain in the back of the right side of the neck to behind right ear area and down the right shoulder. No injury or trauma.    Neck Pain  Associated symptoms include headaches. Pertinent negatives include no fever.   Patient is in today for Neck Pain  Symptoms started approx 08/23/2021. When he was at work with [employer] department. States that it was a slow onset. States that it is right sided.  Originates from right paraspinal goes up along underneath the base of the skull to posterior portion of the ear.  He describes a constant dull aching throb.  States no recent injury or sick contacts.  No upper respiratory symptoms  States that he tried to massage it out and helped briefly. Tylenol did not help   Review of Systems  Constitutional:  Negative for chills and fever.  Eyes:  Positive for blurred vision.  Musculoskeletal:  Positive for myalgias and neck pain.  Neurological:  Positive for headaches.        Objective:    BP 108/64   Pulse 67   Temp 97.6 F (36.4 C) (Temporal)   Resp 14   Ht 5\' 11"  (1.803 m)   Wt 193 lb 6 oz (87.7 kg)   SpO2 97%   BMI 26.97 kg/m    Physical Exam Vitals and nursing note reviewed.  Constitutional:      Appearance: Normal appearance.  HENT:     Right Ear: Tympanic membrane, ear canal and external ear normal.     Left Ear: Tympanic membrane, ear canal and external ear normal.  Neck:   Cardiovascular:     Rate and Rhythm: Normal rate and regular rhythm.     Pulses:          Radial pulses are 2+ on the right side and 2+ on the left side.     Heart sounds: Normal heart sounds.  Pulmonary:     Breath sounds: Normal breath sounds.  Musculoskeletal:     Comments: Lateral movements to left along with rotation increases discomfort.   Neurological:     Mental Status: He is alert.     Deep Tendon Reflexes:     Reflex Scores:      Bicep reflexes are 2+ on the right side and 2+ on the left side.    Comments: Bilateral upper extremity strength 5/5     No results found for any visits on 08/30/21.      Assessment & Plan:   Problem List Items Addressed This Visit       Musculoskeletal and Integument   Muscle strain - Primary    Exam and presentation consistent with muscle strain.  Will write Flexeril 5 mg twice daily as needed.  Sedation precautions reviewed patient can also clean using over-the-counter analgesics as needed and directed like Tylenol or ibuprofen.  Patient also use heat or ice whichever makes it feel better.  Follow-up with primary care provider no improvement.      Relevant Medications   cyclobenzaprine (FLEXERIL) 5 MG tablet    Meds ordered this encounter  Medications   cyclobenzaprine (FLEXERIL) 5 MG tablet    Sig: Take 1 tablet (5 mg total) by mouth 2 (two) times daily as needed for  muscle spasms.    Dispense:  20 tablet    Refill:  0    Order Specific Question:   Supervising Provider    Answer:   TOWER, MARNE A [[id number]    Return if symptoms worsen or fail to improve.  Audria Nine, NP

## 2021-08-30 NOTE — Assessment & Plan Note (Signed)
Exam and presentation consistent with muscle strain.  Will write Flexeril 5 mg twice daily as needed.  Sedation precautions reviewed patient can also clean using over-the-counter analgesics as needed and directed like Tylenol or ibuprofen.  Patient also use heat or ice whichever makes it feel better.  Follow-up with primary care provider no improvement.

## 2021-08-30 NOTE — Patient Instructions (Signed)
Nice to see you today I have sent in a muscle relaxer, this can make you sleepy/sedated You can use heat or ice, which ever feels the best. You can use ibuprofen or tylenol as directed.  Follow up with Dr Darrick Huntsman if you do not improve.

## 2022-05-16 ENCOUNTER — Telehealth: Payer: Self-pay

## 2022-05-16 NOTE — Telephone Encounter (Signed)
Called pt to inquire more about his appt on tomorrow about his shoulder, we saw he was seen in the ED for it and they referred [facility] to [facility],   Pt stated that they told him to get primary care involved as [facility] that he can get an MRI and that the referral would need to come from primary.   Pt wants to get an MRI done.

## 2022-05-17 ENCOUNTER — Encounter: Payer: Self-pay | Admitting: Nurse Practitioner

## 2022-05-17 ENCOUNTER — Ambulatory Visit (INDEPENDENT_AMBULATORY_CARE_PROVIDER_SITE_OTHER): Admitting: Nurse Practitioner

## 2022-05-17 VITALS — BP 118/78 | HR 58 | Temp 98.2°F | Ht 71.0 in | Wt 204.6 lb

## 2022-05-17 DIAGNOSIS — M25512 Pain in left shoulder: Secondary | ICD-10-CM | POA: Diagnosis not present

## 2022-05-17 MED ORDER — CYCLOBENZAPRINE HCL 5 MG PO TABS
5.0000 mg | ORAL_TABLET | Freq: Three times a day (TID) | ORAL | 0 refills | Status: DC | PRN
Start: 2022-05-17 — End: 2022-06-21
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
## 2022-05-22 ENCOUNTER — Ambulatory Visit
Admission: RE | Admit: 2022-05-22 | Discharge: 2022-05-22 | Disposition: A | Discharge status: Home / Self Care | Source: Ambulatory Visit | Attending: Nurse Practitioner | Admitting: Nurse Practitioner

## 2022-05-22 ENCOUNTER — Other Ambulatory Visit: Payer: Self-pay | Admitting: Nurse Practitioner

## 2022-05-22 DIAGNOSIS — M25512 Pain in left shoulder: Secondary | ICD-10-CM | POA: Diagnosis not present

## 2022-05-22 NOTE — Telephone Encounter (Signed)
Pt called in staying that he saw Bethanie Dicker 5/1, and still has the shoulder pain and he was wondering if Konrad Dolores can send over some pain meds to his pharmacy? Any questions, pt @[phone]

## 2022-05-25 ENCOUNTER — Other Ambulatory Visit: Payer: Self-pay | Admitting: Nurse Practitioner

## 2022-05-25 DIAGNOSIS — M25512 Pain in left shoulder: Secondary | ICD-10-CM

## 2022-05-25 MED ORDER — TRAMADOL HCL 50 MG PO TABS
50.0000 mg | ORAL_TABLET | Freq: Four times a day (QID) | ORAL | 0 refills | Status: AC | PRN
Start: 2022-05-25 — End: 2022-05-30

## 2022-05-25 NOTE — Telephone Encounter (Signed)
Pt called in to check status of previous message. Meds for shoulder pain to be sent over to his pharmacy.  Pt also looking for the results from his MRI that took place on Monday 5/6 as well.

## 2022-05-25 NOTE — Telephone Encounter (Signed)
Pt has been informed.

## 2022-05-26 ENCOUNTER — Telehealth: Payer: Self-pay | Admitting: Internal Medicine

## 2022-05-26 ENCOUNTER — Other Ambulatory Visit: Payer: Self-pay | Admitting: Nurse Practitioner

## 2022-05-26 DIAGNOSIS — S42293A Other displaced fracture of upper end of unspecified humerus, initial encounter for closed fracture: Secondary | ICD-10-CM

## 2022-05-26 NOTE — Telephone Encounter (Signed)
Patient is needing detailed light duty restriction letter sent to [employer]. Per the patient the other letter was not detailed enough. Also he stated the provider will be receiving a fax from [employer] for his out of work status.

## 2022-05-29 NOTE — Telephone Encounter (Signed)
Pt called in to check the status of previous message. Concerning some paperwork provider need to fill for him. As per pt, his job needs this paperwork asap.

## 2022-05-29 NOTE — Telephone Encounter (Signed)
Called and informed pt that Bethanie Dicker, NP is out of office on Monday's and that she will create this letter as soon as she can when she returns. Pt also inquired about [employer] short term disability forms if they have been filled out. I informed pt that we have not received these forms but that I will check with Dr. Darrick Huntsman to see if they received these forms.    Once I spoke with Shanda Bumps, CMA she informed me that they have not received the forms either. I then sent pt a mychart msg informing him that his best option is for him to hand deliver these forms to the office to get them filled out.   Pt will need work letter and [employer] forms filled out, once I receive the forms I will pl,ace in provider to be signed folder.

## 2022-05-30 ENCOUNTER — Encounter: Payer: Self-pay | Admitting: Nurse Practitioner

## 2022-06-06 NOTE — Telephone Encounter (Signed)
[employer] forms have been received on today. I have filled out what I could on the forms and the form has been placed in provider to be signed folder.   Pt has been notified.

## 2022-06-07 DIAGNOSIS — Z0279 Encounter for issue of other medical certificate: Secondary | ICD-10-CM

## 2022-06-08 NOTE — Telephone Encounter (Signed)
Forms have been faxed tot he given fax number [phone] and pt has been notified they are ready for pick up, forms have been placed an envelope in the designated pick up area labeled

## 2022-06-15 ENCOUNTER — Other Ambulatory Visit: Payer: Self-pay | Admitting: Orthopedic Surgery

## 2022-06-15 ENCOUNTER — Encounter: Payer: Self-pay | Admitting: Orthopedic Surgery

## 2022-06-21 ENCOUNTER — Encounter
Admission: RE | Admit: 2022-06-21 | Discharge: 2022-06-21 | Disposition: A | Discharge status: Home / Self Care | Source: Ambulatory Visit | Attending: Orthopedic Surgery | Admitting: Orthopedic Surgery

## 2022-06-21 HISTORY — DX: Superior glenoid labrum lesion of unspecified shoulder, initial encounter: S43.439A

## 2022-06-21 NOTE — Patient Instructions (Signed)
Your procedure is scheduled on:06-23-22 Friday Report to the Registration Desk on the 1st floor of the [facility].Then proceed to the 2nd floor Surgery Desk To find out your arrival time, please call [phone] between 1PM - 3PM on:06-22-22 Thursday If your arrival time is 6:00 am, do not arrive before that time as the [facility] entrance doors do not open until 6:00 am.  REMEMBER: Instructions that are not followed completely may result in serious medical risk, up to and including death; or upon the discretion of your surgeon and anesthesiologist your surgery may need to be rescheduled.  Do not eat food after midnight the night before surgery.  No gum chewing or hard candies.  You may however, drink CLEAR liquids up to 2 hours before you are scheduled to arrive for your surgery. Do not drink anything within 2 hours of your scheduled arrival time.  Clear liquids include: - water  - apple juice without pulp - gatorade (not RED colors) - black coffee or tea (Do NOT add milk or creamers to the coffee or tea) Do NOT drink anything that is not on this list.  In addition, your doctor has ordered for you to drink the provided:  Ensure Pre-Surgery Clear Carbohydrate Drink  Drinking this carbohydrate drink up to two hours before surgery helps to reduce insulin resistance and improve patient outcomes. Please complete drinking 2 hours before scheduled arrival time.  One week prior to surgery: Stop Anti-inflammatories (NSAIDS) such as meloxicam (MOBIC), Advil, Aleve, Ibuprofen, Motrin, Naproxen, Naprosyn and Aspirin based products such as Excedrin, Goody's Powder, BC Powder.You may however,  take Tylenol/Percocet if needed for pain up until the day of surgery. Stop ANY OVER THE COUNTER supplements/vitamins NOW (06-21-22) until after surgery.  TAKE ONLY THESE MEDICATIONS THE MORNING OF SURGERY WITH A SIP OF WATER: -You may take oxyCODONE-acetaminophen (PERCOCET/ROXICET) if needed for pain  No  Alcohol for 24 hours before or after surgery.  No Smoking including e-cigarettes for 24 hours before surgery.  No chewable tobacco products for at least 6 hours before surgery.  No nicotine patches on the day of surgery.  Do not use any "recreational" drugs for at least a week (preferably 2 weeks) before your surgery.  Please be advised that the combination of cocaine and anesthesia may have negative outcomes, up to and including death. If you test positive for cocaine, your surgery will be cancelled.  On the morning of surgery brush your teeth with toothpaste and water, you may rinse your mouth with mouthwash if you wish. Do not swallow any toothpaste or mouthwash.  Use CHG Soap as directed on instruction sheet.  Do not wear jewelry, make-up, hairpins, clips or nail polish.  Do not wear lotions, powders, or perfumes.   Do not shave body hair from the neck down 48 hours before surgery.  Contact lenses, hearing aids and dentures may not be worn into surgery.  Do not bring valuables to the hospital. [facility] is not responsible for any missing/lost belongings or valuables.    Notify your doctor if there is any change in your medical condition (cold, fever, infection).  Wear comfortable clothing (specific to your surgery type) to the hospital.  After surgery, you can help prevent lung complications by doing breathing exercises.  Take deep breaths and cough every 1-2 hours. Your doctor may order a device called an Incentive Spirometer to help you take deep breaths. When coughing or sneezing, hold a pillow firmly against your incision with both hands.  This is called "splinting." Doing this helps protect your incision. It also decreases belly discomfort.  If you are being admitted to the hospital overnight, leave your suitcase in the car. After surgery it may be brought to your room.  In case of increased patient census, it may be necessary for you, the patient, to continue your  postoperative care in the Same Day Surgery department.  If you are being discharged the day of surgery, you will not be allowed to drive home. You will need a responsible individual to drive you home and stay with you for 24 hours after surgery.   If you are taking public transportation, you will need to have a responsible individual with you.  Please call the Pre-admissions Testing Dept. at [phone] if you have any questions about these instructions.  Surgery Visitation Policy:  Patients having surgery or a procedure may have two visitors.  Children under the age of 58 must have an adult with them who is not the patient.     Preparing for Surgery with CHLORHEXIDINE GLUCONATE (CHG) Soap  Chlorhexidine Gluconate (CHG) Soap  o An antiseptic cleaner that kills germs and bonds with the skin to continue killing germs even after washing  o Used for showering the night before surgery and morning of surgery  Before surgery, you can play an important role by reducing the number of germs on your skin.  CHG (Chlorhexidine gluconate) soap is an antiseptic cleanser which kills germs and bonds with the skin to continue killing germs even after washing.  Please do not use if you have an allergy to CHG or antibacterial soaps. If your skin becomes reddened/irritated stop using the CHG.  1. Shower the NIGHT BEFORE SURGERY and the MORNING OF SURGERY with CHG soap.  2. If you choose to wash your hair, wash your hair first as usual with your normal shampoo.  3. After shampooing, rinse your hair and body thoroughly to remove the shampoo.  4. Use CHG as you would any other liquid soap. You can apply CHG directly to the skin and wash gently with a scrungie or a clean washcloth.  5. Apply the CHG soap to your body only from the neck down. Do not use on open wounds or open sores. Avoid contact with your eyes, ears, mouth, and genitals (private parts). Wash face and genitals (private parts) with  your normal soap.  6. Wash thoroughly, paying special attention to the area where your surgery will be performed.  7. Thoroughly rinse your body with warm water.  8. Do not shower/wash with your normal soap after using and rinsing off the CHG soap.  9. Pat yourself dry with a clean towel.  10. Wear clean pajamas to bed the night before surgery.  12. Place clean sheets on your bed the night of your first shower and do not sleep with pets.  13. Shower again with the CHG soap on the day of surgery prior to arriving at the hospital.  14. Do not apply any deodorants/lotions/powders.  15. Please wear clean clothes to the hospital.  How to Use an Incentive Spirometer An incentive spirometer is a tool that measures how well you are filling your lungs with each breath. Learning to take long, deep breaths using this tool can help you keep your lungs clear and active. This may help to reverse or lessen your chance of developing breathing (pulmonary) problems, especially infection. You may be asked to use a spirometer: After a surgery. If you  have a lung problem or a history of smoking. After a long period of time when you have been unable to move or be active. If the spirometer includes an indicator to show the highest number that you have reached, your health care provider or respiratory therapist will help you set a goal. Keep a log of your progress as told by your health care provider. What are the risks? Breathing too quickly may cause dizziness or cause you to pass out. Take your time so you do not get dizzy or light-headed. If you are in pain, you may need to take pain medicine before doing incentive spirometry. It is harder to take a deep breath if you are having pain. How to use your incentive spirometer  Sit up on the edge of your bed or on a chair. Hold the incentive spirometer so that it is in an upright position. Before you use the spirometer, breathe out normally. Place the  mouthpiece in your mouth. Make sure your lips are closed tightly around it. Breathe in slowly and as deeply as you can through your mouth, causing the piston or the ball to rise toward the top of the chamber. Hold your breath for 3-5 seconds, or for as long as possible. If the spirometer includes a coach indicator, use this to guide you in breathing. Slow down your breathing if the indicator goes above the marked areas. Remove the mouthpiece from your mouth and breathe out normally. The piston or ball will return to the bottom of the chamber. Rest for a few seconds, then repeat the steps 10 or more times. Take your time and take a few normal breaths between deep breaths so that you do not get dizzy or light-headed. Do this every 1-2 hours when you are awake. If the spirometer includes a goal marker to show the highest number you have reached (best effort), use this as a goal to work toward during each repetition. After each set of 10 deep breaths, cough a few times. This will help to make sure that your lungs are clear. If you have an incision on your chest or abdomen from surgery, place a pillow or a rolled-up towel firmly against the incision when you cough. This can help to reduce pain while taking deep breaths and coughing. General tips When you are able to get out of bed: Walk around often. Continue to take deep breaths and cough in order to clear your lungs. Keep using the incentive spirometer until your health care provider says it is okay to stop using it. If you have been in the hospital, you may be told to keep using the spirometer at home. Contact a health care provider if: You are having difficulty using the spirometer. You have trouble using the spirometer as often as instructed. Your pain medicine is not giving enough relief for you to use the spirometer as told. You have a fever. Get help right away if: You develop shortness of breath. You develop a cough with bloody mucus from  the lungs. You have fluid or blood coming from an incision site after you cough. Summary An incentive spirometer is a tool that can help you learn to take long, deep breaths to keep your lungs clear and active. You may be asked to use a spirometer after a surgery, if you have a lung problem or a history of smoking, or if you have been inactive for a long period of time. Use your incentive spirometer as instructed every 1-2  hours while you are awake. If you have an incision on your chest or abdomen, place a pillow or a rolled-up towel firmly against your incision when you cough. This will help to reduce pain. Get help right away if you have shortness of breath, you cough up bloody mucus, or blood comes from your incision when you cough. This information is not intended to replace advice given to you by your health care provider. Make sure you discuss any questions you have with your health care provider. Document Revised: 03/24/2019 Document Reviewed: 03/24/2019 Elsevier Patient Education  2024 [employer].

## 2022-06-22 MED ORDER — CHLORHEXIDINE GLUCONATE 0.12 % MT SOLN
15.0000 mL | Freq: Once | OROMUCOSAL | Status: AC
  Administered 2022-06-23: 15 mL via OROMUCOSAL

## 2022-06-22 MED ORDER — CEFAZOLIN SODIUM-DEXTROSE 2-4 GM/100ML-% IV SOLN
2.0000 g | INTRAVENOUS | Status: AC
  Administered 2022-06-23: 2 g via INTRAVENOUS

## 2022-06-22 MED ORDER — ORAL CARE MOUTH RINSE: 15.0000 mL | Freq: Once | OROMUCOSAL | Status: AC

## 2022-06-22 MED ORDER — LACTATED RINGERS IV SOLN: INTRAVENOUS | Status: DC

## 2022-06-22 MED ORDER — FAMOTIDINE 20 MG PO TABS
20.0000 mg | ORAL_TABLET | Freq: Once | ORAL | Status: AC
  Administered 2022-06-23: 20 mg via ORAL

## 2022-06-23 ENCOUNTER — Ambulatory Visit: Payer: Worker's Compensation

## 2022-06-23 ENCOUNTER — Other Ambulatory Visit: Payer: Self-pay

## 2022-06-23 ENCOUNTER — Ambulatory Visit
Admission: RE | Admit: 2022-06-23 | Discharge: 2022-06-23 | Disposition: A | Discharge status: Home / Self Care | Payer: Worker's Compensation | Attending: Orthopedic Surgery | Admitting: Orthopedic Surgery

## 2022-06-23 ENCOUNTER — Ambulatory Visit: Payer: Worker's Compensation | Admitting: Anesthesiology

## 2022-06-23 ENCOUNTER — Encounter
Admission: RE | Disposition: A | Discharge status: Home / Self Care | Payer: Self-pay | Source: Home / Self Care | Attending: Orthopedic Surgery

## 2022-06-23 ENCOUNTER — Encounter: Payer: Self-pay | Admitting: Orthopedic Surgery

## 2022-06-23 DIAGNOSIS — S43432A Superior glenoid labrum lesion of left shoulder, initial encounter: Secondary | ICD-10-CM | POA: Insufficient documentation

## 2022-06-23 DIAGNOSIS — Y990 Civilian activity done for income or pay: Secondary | ICD-10-CM | POA: Insufficient documentation

## 2022-06-23 DIAGNOSIS — M7522 Bicipital tendinitis, left shoulder: Secondary | ICD-10-CM | POA: Diagnosis not present

## 2022-06-23 HISTORY — PX: SHOULDER ARTHROSCOPY WITH LABRAL REPAIR: SHX5691

## 2022-06-23 SURGERY — ARTHROSCOPY, SHOULDER, WITH GLENOID LABRUM REPAIR
Anesthesia: General | Site: Shoulder | Laterality: Left

## 2022-06-23 MED ORDER — MIDAZOLAM HCL 2 MG/2ML IJ SOLN
INTRAMUSCULAR | Status: AC
  Filled 2022-06-23: qty 2

## 2022-06-23 MED ORDER — ACETAMINOPHEN 10 MG/ML IV SOLN
INTRAVENOUS | Status: DC | PRN
  Administered 2022-06-23: 1000 mg via INTRAVENOUS

## 2022-06-23 MED ORDER — MIDAZOLAM HCL 2 MG/2ML IJ SOLN
1.0000 mg | Freq: Once | INTRAMUSCULAR | Status: AC
  Administered 2022-06-23: 1 mg via INTRAVENOUS

## 2022-06-23 MED ORDER — OXYCODONE HCL 5 MG PO TABS: 5.0000 mg | ORAL_TABLET | ORAL | 0 refills | Status: DC | PRN

## 2022-06-23 MED ORDER — MIDAZOLAM HCL 2 MG/2ML IJ SOLN
INTRAMUSCULAR | Status: DC | PRN
  Administered 2022-06-23: 2 mg via INTRAVENOUS

## 2022-06-23 MED ORDER — DEXAMETHASONE SODIUM PHOSPHATE 10 MG/ML IJ SOLN
INTRAMUSCULAR | Status: DC | PRN
  Administered 2022-06-23: 8 mg via INTRAVENOUS

## 2022-06-23 MED ORDER — FAMOTIDINE 20 MG PO TABS
ORAL_TABLET | ORAL | Status: AC
  Filled 2022-06-23: qty 1

## 2022-06-23 MED ORDER — ONDANSETRON 4 MG PO TBDP: 4.0000 mg | ORAL_TABLET | Freq: Three times a day (TID) | ORAL | 0 refills | Status: DC | PRN

## 2022-06-23 MED ORDER — ROCURONIUM BROMIDE 100 MG/10ML IV SOLN
INTRAVENOUS | Status: DC | PRN
  Administered 2022-06-23: 50 mg via INTRAVENOUS
  Administered 2022-06-23 (×2): 20 mg via INTRAVENOUS

## 2022-06-23 MED ORDER — PROPOFOL 10 MG/ML IV BOLUS
INTRAVENOUS | Status: DC | PRN
  Administered 2022-06-23: 200 mg via INTRAVENOUS

## 2022-06-23 MED ORDER — OXYCODONE HCL 5 MG PO TABS
ORAL_TABLET | ORAL | Status: AC
  Filled 2022-06-23: qty 1

## 2022-06-23 MED ORDER — ASPIRIN 325 MG PO TBEC: 325.0000 mg | DELAYED_RELEASE_TABLET | Freq: Every day | ORAL | 0 refills | Status: AC

## 2022-06-23 MED ORDER — PHENYLEPHRINE HCL-NACL 20-0.9 MG/250ML-% IV SOLN
INTRAVENOUS | Status: DC | PRN
  Administered 2022-06-23: 25 ug/min via INTRAVENOUS

## 2022-06-23 MED ORDER — FENTANYL CITRATE (PF) 100 MCG/2ML IJ SOLN: 25.0000 ug | INTRAMUSCULAR | Status: DC | PRN

## 2022-06-23 MED ORDER — EPINEPHRINE PF 1 MG/ML IJ SOLN
INTRAMUSCULAR | Status: AC
  Filled 2022-06-23: qty 4

## 2022-06-23 MED ORDER — PROPOFOL 10 MG/ML IV BOLUS
INTRAVENOUS | Status: AC
  Filled 2022-06-23: qty 40

## 2022-06-23 MED ORDER — ACETAMINOPHEN 10 MG/ML IV SOLN
INTRAVENOUS | Status: AC
  Filled 2022-06-23: qty 100

## 2022-06-23 MED ORDER — BUPIVACAINE LIPOSOME 1.3 % IJ SUSP
INTRAMUSCULAR | Status: AC
  Filled 2022-06-23: qty 10

## 2022-06-23 MED ORDER — PHENYLEPHRINE HCL-NACL 20-0.9 MG/250ML-% IV SOLN
INTRAVENOUS | Status: AC
  Filled 2022-06-23: qty 250

## 2022-06-23 MED ORDER — LACTATED RINGERS IR SOLN
Status: DC | PRN
  Administered 2022-06-23: 12004 mL

## 2022-06-23 MED ORDER — BUPIVACAINE HCL (PF) 0.5 % IJ SOLN
INTRAMUSCULAR | Status: DC | PRN
  Administered 2022-06-23: 10 mL

## 2022-06-23 MED ORDER — ONDANSETRON HCL 4 MG/2ML IJ SOLN
INTRAMUSCULAR | Status: DC | PRN
  Administered 2022-06-23: 4 mg via INTRAVENOUS

## 2022-06-23 MED ORDER — OXYCODONE HCL 5 MG PO TABS
5.0000 mg | ORAL_TABLET | Freq: Once | ORAL | Status: AC | PRN
  Administered 2022-06-23: 5 mg via ORAL

## 2022-06-23 MED ORDER — SUGAMMADEX SODIUM 200 MG/2ML IV SOLN
INTRAVENOUS | Status: DC | PRN
  Administered 2022-06-23: 200 mg via INTRAVENOUS

## 2022-06-23 MED ORDER — LIDOCAINE HCL (PF) 1 % IJ SOLN
INTRAMUSCULAR | Status: AC
  Filled 2022-06-23: qty 30

## 2022-06-23 MED ORDER — ACETAMINOPHEN 500 MG PO TABS: 1000.0000 mg | ORAL_TABLET | Freq: Three times a day (TID) | ORAL | 2 refills | Status: AC

## 2022-06-23 MED ORDER — OXYCODONE HCL 5 MG/5ML PO SOLN: 5.0000 mg | Freq: Once | ORAL | Status: AC | PRN

## 2022-06-23 MED ORDER — BUPIVACAINE LIPOSOME 1.3 % IJ SUSP
INTRAMUSCULAR | Status: AC
  Filled 2022-06-23: qty 20

## 2022-06-23 MED ORDER — BUPIVACAINE LIPOSOME 1.3 % IJ SUSP
INTRAMUSCULAR | Status: DC | PRN
  Administered 2022-06-23: 20 mL

## 2022-06-23 MED ORDER — FENTANYL CITRATE PF 50 MCG/ML IJ SOSY: 25.0000 ug | PREFILLED_SYRINGE | Freq: Once | INTRAMUSCULAR | Status: DC

## 2022-06-23 MED ORDER — CHLORHEXIDINE GLUCONATE 0.12 % MT SOLN
OROMUCOSAL | Status: AC
  Filled 2022-06-23: qty 15

## 2022-06-23 MED ORDER — FENTANYL CITRATE (PF) 100 MCG/2ML IJ SOLN
INTRAMUSCULAR | Status: AC
  Filled 2022-06-23: qty 2

## 2022-06-23 MED ORDER — LIDOCAINE HCL (CARDIAC) PF 100 MG/5ML IV SOSY
PREFILLED_SYRINGE | INTRAVENOUS | Status: DC | PRN
  Administered 2022-06-23: 40 mg via INTRAVENOUS

## 2022-06-23 MED ORDER — DEXMEDETOMIDINE HCL IN NACL 80 MCG/20ML IV SOLN
INTRAVENOUS | Status: DC | PRN
  Administered 2022-06-23: 12 ug via INTRAVENOUS
  Administered 2022-06-23: 8 ug via INTRAVENOUS
  Administered 2022-06-23: 12 ug via INTRAVENOUS

## 2022-06-23 MED ORDER — CEFAZOLIN SODIUM-DEXTROSE 2-4 GM/100ML-% IV SOLN
INTRAVENOUS | Status: AC
  Filled 2022-06-23: qty 100

## 2022-06-23 MED ORDER — FENTANYL CITRATE PF 50 MCG/ML IJ SOSY
50.0000 ug | PREFILLED_SYRINGE | Freq: Once | INTRAMUSCULAR | Status: AC
  Administered 2022-06-23: 50 ug via INTRAVENOUS

## 2022-06-23 MED ORDER — BUPIVACAINE HCL (PF) 0.5 % IJ SOLN
INTRAMUSCULAR | Status: AC
  Filled 2022-06-23: qty 10

## 2022-06-23 MED ORDER — FENTANYL CITRATE PF 50 MCG/ML IJ SOSY
PREFILLED_SYRINGE | INTRAMUSCULAR | Status: AC
  Filled 2022-06-23: qty 1

## 2022-06-23 MED ORDER — PHENYLEPHRINE HCL (PRESSORS) 10 MG/ML IV SOLN
INTRAVENOUS | Status: DC | PRN
  Administered 2022-06-23 (×2): 80 ug via INTRAVENOUS

## 2022-06-23 MED ORDER — RINGERS IRRIGATION IR SOLN
Status: DC | PRN
  Administered 2022-06-23: 6000 mL

## 2022-06-23 SURGICAL SUPPLY — 47 items
ADPR IRR PORT MULTIBAG TUBE (MISCELLANEOUS) ×2
ANCH SUT 2 FBRTK KNTLS 1.8 (Anchor) ×7 IMPLANT
ANCH SUT 2.9 PUSHLOCK ANCH (Orthopedic Implant) ×1 IMPLANT
ANCHOR SUT 1.8 FIBERTAK SB KL (Anchor) IMPLANT
APL PRP STRL LF DISP 70% ISPRP (MISCELLANEOUS) ×2
BLADE SHAVER 4.5X7 STR FR (MISCELLANEOUS) ×1 IMPLANT
BUR BR 5.5 WIDE MOUTH (BURR) IMPLANT
CANNULA PART THRD DISP 5.75X7 (CANNULA) IMPLANT
CANNULA TWIST IN 8.25X7CM (CANNULA) IMPLANT
CHLORAPREP W/TINT 26 (MISCELLANEOUS) ×1 IMPLANT
COOLER POLAR GLACIER W/PUMP (MISCELLANEOUS) ×1 IMPLANT
COVER LIGHT HANDLE STERIS (MISCELLANEOUS) IMPLANT
DRAPE IMP U-DRAPE 54X76 (DRAPES) ×1 IMPLANT
DRAPE INCISE IOBAN 66X45 STRL (DRAPES) ×1 IMPLANT
DRAPE U-SHAPE 48X52 POLY STRL (PACKS) ×2 IMPLANT
ELECT REM PT RETURN 9FT ADLT (ELECTROSURGICAL)
ELECTRODE REM PT RTRN 9FT ADLT (ELECTROSURGICAL) IMPLANT
GAUZE SPONGE 4X4 12PLY STRL (GAUZE/BANDAGES/DRESSINGS) ×1 IMPLANT
GAUZE XEROFORM 1X8 LF (GAUZE/BANDAGES/DRESSINGS) ×1 IMPLANT
GLOVE SRG 8 PF TXTR STRL LF DI (GLOVE) ×1 IMPLANT
GLOVE SURG ENC TEXT LTX SZ7.5 (GLOVE) ×1 IMPLANT
GLOVE SURG ORTHO LTX SZ8 (GLOVE) ×1 IMPLANT
GLOVE SURG UNDER POLY LF SZ8 (GLOVE) ×1
GOWN STRL REUS W/ TWL LRG LVL3 (GOWN DISPOSABLE) ×1 IMPLANT
GOWN STRL REUS W/ TWL XL LVL3 (GOWN DISPOSABLE) ×1 IMPLANT
GOWN STRL REUS W/TWL LRG LVL3 (GOWN DISPOSABLE) ×1
GOWN STRL REUS W/TWL XL LVL3 (GOWN DISPOSABLE) ×1
IV LACTATED RINGER IRRG 3000ML (IV SOLUTION) ×6
IV LR IRRIG 3000ML ARTHROMATIC (IV SOLUTION) ×6 IMPLANT
KIT CVD SPEAR FBRTK 1.8 DRILL (KITS) IMPLANT
KIT STABILIZATION SHOULDER (MISCELLANEOUS) ×1 IMPLANT
KIT TURNOVER KIT A (KITS) ×1 IMPLANT
LASSO 25 DEG QUICKPASS CVD LT (SUTURE) IMPLANT
MANIFOLD NEPTUNE II (INSTRUMENTS) ×1 IMPLANT
MASK FACE SPIDER DISP (MASK) ×1 IMPLANT
MAT ABSORB FLUID 56X50 GRAY (MISCELLANEOUS) ×2 IMPLANT
PACK ARTHROSCOPY SHOULDER (MISCELLANEOUS) ×1 IMPLANT
PAD WRAPON POLAR SHDR XLG (MISCELLANEOUS) ×1 IMPLANT
SET Y ADAPTER MULIT-BAG IRRIG (MISCELLANEOUS) ×2 IMPLANT
SPONGE T-LAP 18X18 ~~LOC~~+RFID (SPONGE) ×1 IMPLANT
SUT ETH BLK MONO 3 0 FS 1 12/B (SUTURE) IMPLANT
SYSTEM IMPL TENODESIS LNT 2.9 (Orthopedic Implant) IMPLANT
TAPE MICROFOAM 4IN (TAPE) ×1 IMPLANT
TUBING INFLOW SET DBFLO PUMP (TUBING) ×1 IMPLANT
TUBING OUTFLOW SET DBLFO PUMP (TUBING) ×1 IMPLANT
WAND WEREWOLF FLOW 90D (MISCELLANEOUS) ×1 IMPLANT
WRAPON POLAR PAD SHDR XLG (MISCELLANEOUS) ×1

## 2022-06-23 NOTE — H&P (Signed)
Paper H&P to be scanned into permanent record. H&P reviewed. No significant changes noted.  

## 2022-06-23 NOTE — Transfer of Care (Signed)
Immediate Anesthesia Transfer of Care Note  Patient: Alexander Gallagher  Procedure(s) Performed: Left shoulder arthroscopic posterior labral repair and capsulorrhaphy with possible arthroscopic biceps tenodesis (Left: Shoulder)  Patient Location: PACU  Anesthesia Type:General  Level of Consciousness: drowsy  Airway & Oxygen Therapy: Patient Spontanous Breathing and Patient connected to face mask oxygen  Post-op Assessment: Report given to RN  Post vital signs: stable  Last Vitals:  Vitals Value Taken Time  BP    Temp    Pulse    Resp    SpO2      Last Pain:  Vitals:   06/23/22 0630  TempSrc: Oral  PainSc: 0-No pain         Complications: No notable events documented.

## 2022-06-23 NOTE — Discharge Instructions (Addendum)
Post-Op Instructions  1. Bracing: You will wear a shoulder immobilizer or sling for at least 4 weeks. Total time will be determined by type of surgical repair and can be discussed at 1st postop appointment. Keep arm in a neutral or externally rotated position using the pillow with the sling. Do NOT rest forearm against belly -- forearm shoulder be resting in a position pointing in front of the body.   2. Driving: No driving for 4 weeks post-op. When driving, do not wear the immobilizer.  3. Activity: No active lifting for 2 months. Wrist, hand, and elbow motion only. Avoid lifting the upper arm away from the body except for hygiene. You are permitted to bend and straighten the elbow actively. You may use your hand and wrist for typing, writing, and managing utensils (cutting food). Do not lift more than a coffee cup for 8 weeks.  When sleeping or resting, inclined positions (recliner chair or wedge pillow) and a pillow under the forearm for support may provide better comfort for up to 4 weeks.  Avoid long distance travel for 4 weeks.  Return to all activities after labral repair normally takes 6 months on average. If rehab goes very well, may be able to do most activities at 4 months, except overhead or contact sports.  4. Physical Therapy: Begins 3-4 days after surgery, and proceeds 2 times per week for the first 4 weeks, then 1-2 times per week from weeks 4-8 post-op.  5. Medications:  - You will be provided a prescription for narcotic pain medicine. After surgery, take 1-2 narcotic tablets every 4 hours if needed for severe pain.  - A prescription for anti-nausea medication will be provided in case the narcotic medicine causes nausea - take 1 tablet every 6 hours only if nauseated.   - Take tylenol 1000 mg every 8 hours for pain.  May stop tylenol 3 days after surgery if you are having minimal pain. - Take ASA 325mg /day x 2 weeks to help prevent DVT/PE (blood clots).   If you are taking  prescription medication for anxiety, depression, insomnia, muscle spasm, chronic pain, or for attention deficit disorder, you are advised that you are at a higher risk of adverse effects with use of narcotics post-op, including narcotic addiction/dependence, depressed breathing, death. If you use non-prescribed substances: alcohol, marijuana, cocaine, heroin, methamphetamines, etc., you are at a higher risk of adverse effects with use of narcotics post-op, including narcotic addiction/dependence, depressed breathing, death. You are advised that taking > 50 morphine milligram equivalents (MME) of narcotic pain medication per day results in twice the risk of overdose or death. For your prescription provided: oxycodone 5 mg - taking more than 6 tablets per day would result in > 50 morphine milligram equivalents (MME) of narcotic pain medication. Be advised that we will prescribe narcotics short-term, for acute post-operative pain only - 3 weeks for major operations such as shoulder repair/reconstruction surgeries.   6. Post-Op Appointment:  Your first post-op appointment will be 10-14 days post-op.  7. Work or School: For most, but not all procedures, we advise staying out of work or school for at least 1 to 2 weeks in order to recover from the stress of surgery and to allow time for healing.   If you need a work or school note this can be provided.     Post-operative Brace: Apply and remove the brace you received as you were instructed to at the time of fitting and as described in detail as  the brace's instructions for use indicate.  Wear the brace for the period of time prescribed by your physician.  The brace can be cleaned with soap and water and allowed to air dry only.  Should the brace result in increased pain, decreased feeling (numbness/tingling), increased swelling or an overall worsening of your medical condition, please contact your doctor immediately.  If an emergency situation occurs as a  result of wearing the brace after normal business hours, please dial 911 and seek immediate medical attention.  Let your doctor know if you have any further questions about the brace issued to you. Refer to the shoulder sling instructions for use if you have any questions regarding the correct fit of your shoulder sling.  [facility] Customer Care for Troubleshooting: [phone]  Video that illustrates how to properly use a shoulder sling: "Instructions for Proper Use of an Orthopaedic Sling" http://bass.com/       Interscalene Nerve Block with Exparel   For your surgery you have received an Interscalene Nerve Block with Exparel. Nerve Blocks affect many types of nerves, including nerves that control movement, pain and normal sensation.  You may experience feelings such as numbness, tingling, heaviness, weakness or the inability to move your arm or the feeling or sensation that your arm has "fallen asleep". A nerve block with Exparel can last up to 5 days.  Usually the weakness wears off first.  The tingling and heaviness usually wear off next.  Finally you may start to notice pain.  Keep in mind that this may occur in any order.  Once a nerve block starts to wear off it is usually completely gone within 60 minutes. ISNB may cause mild shortness of breath, a hoarse voice, blurry vision, unequal pupils, or drooping of the face on the same side as the nerve block.  These symptoms will usually resolve with the numbness.  Very rarely the procedure itself can cause mild seizures. If needed, your surgeon will give you a prescription for pain medication.  It will take about 60 minutes for the oral pain medication to become fully effective.  So, it is recommended that you start taking this medication before the nerve block first begins to wear off, or when you first begin to feel discomfort. Take your pain medication only as prescribed.  Pain medication can cause sedation and decrease  your breathing if you take more than you need for the level of pain that you have. Nausea is a common side effect of many pain medications.  You may want to eat something before taking your pain medicine to prevent nausea. After an Interscalene nerve block, you cannot feel pain, pressure or extremes in temperature in the effected arm.  Because your arm is numb it is at an increased risk for injury.  To decrease the possibility of injury, please practice the following:  While you are awake change the position of your arm frequently to prevent too much pressure on any one area for prolonged periods of time.  If you have a cast or tight dressing, check the color or your fingers every couple of hours.  Call your surgeon with the appearance of any discoloration (white or blue). If you are given a sling to wear before you go home, please wear it  at all times until the block has completely worn off.  Do not get up at night without your sling. Please contact [facility] or your surgeon if you do not begin to regain sensation after 7 days  from the surgery.  Anesthesia may be contacted by calling the Same Day Surgery Department, Mon. through Fri., 6 am to 4 pm at [phone].   If you experience any other problems or concerns, please contact your surgeon's office. If you experience severe or prolonged shortness of breath go to the nearest emergency department.   SHOULDER SLING IMMOBILIZER   VIDEO Slingshot 2 Shoulder Brace Application - YouTube ---https://www.porter.info/  INSTRUCTIONS While supporting the injured arm, slide the forearm into the sling. Wrap the adjustable shoulder strap around the neck and shoulders and attach the strap end to the sling using  the "alligator strap tab."  Adjust the shoulder strap to the required length. Position the shoulder pad behind the neck. To secure the shoulder pad location (optional), pull the shoulder strap away from the shoulder pad,  unfold the hook material on the top of the pad, then press the shoulder strap back onto the hook material to secure the pad in place. Attach the closure strap across the open top of the sling. Position the strap so that it holds the arm securely in the sling. Next, attach the thumb strap to the open end of the sling between the thumb and fingers. After sling has been fit, it may be easily removed and reapplied using the quick release buckle on shoulder strap. If a neutral pillow or 15 abduction pillow is included, place the pillow at the waistline. Attach the sling to the pillow, lining up hook material on the pillow with the loop on sling. Adjust the waist strap to fit.  If waist strap is too long, cut it to fit. Use the small piece of double sided hook material (located on top of the pillow) to secure the strap end. Place the double sided hook material on the inside of the cut strap end and secure it to the waist strap.     If no pillow is included, attach the waist strap to the sling and adjust to fit.    Washing Instructions: Straps and sling must be removed and cleaned regularly depending on your activity level and perspiration. Hand wash straps and sling in cold water with mild detergent, rinse, air dry        AMBULATORY SURGERY  DISCHARGE INSTRUCTIONS  The drugs that you were given will stay in your system until tomorrow so for the next 24 hours you should not:  Drive an automobile Make any legal decisions Drink any alcoholic beverage  You may resume regular meals tomorrow.  Today it is better to start with liquids and gradually work up to solid foods.  You may eat anything you prefer, but it is better to start with liquids, then soup and crackers, and gradually work up to solid foods.  Please notify your doctor immediately if you have any unusual bleeding, trouble breathing, redness and pain at the surgery site, drainage, fever, or pain not relieved by medication.  Additional  Instructions:  Leave teal colored arm bracelet on for 96 hours.  Please contact your physician with any problems or Same Day Surgery at [phone], Monday through Friday 6 am to 4 pm, or [place] [facility] number at [phone].

## 2022-06-23 NOTE — Anesthesia Postprocedure Evaluation (Signed)
Anesthesia Post Note  Patient: Joanathan Affeldt  Procedure(s) Performed: Left shoulder arthroscopic posterior labral repair and capsulorrhaphy with possible arthroscopic biceps tenodesis (Left: Shoulder)  Patient location during evaluation: PACU Anesthesia Type: General Level of consciousness: awake and alert Pain management: pain level controlled Vital Signs Assessment: post-procedure vital signs reviewed and stable Respiratory status: spontaneous breathing, nonlabored ventilation and respiratory function stable Cardiovascular status: blood pressure returned to baseline and stable Postop Assessment: no apparent nausea or vomiting Anesthetic complications: no   No notable events documented.   Last Vitals:  Vitals:   06/23/22 1100 06/23/22 1111  BP: (!) 111/59 117/72  Pulse: 77 75  Resp: 19 17  Temp: (!) 36.1 C 36.4 C  SpO2: 96% 94%    Last Pain:  Vitals:   06/23/22 1111  TempSrc: Temporal  PainSc: 7                  Foye Deer

## 2022-06-23 NOTE — Anesthesia Procedure Notes (Signed)
Anesthesia Regional Block: Interscalene brachial plexus block   Pre-Anesthetic Checklist: , timeout performed,  Correct Patient, Correct Site, Correct Laterality,  Correct Procedure, Correct Position, site marked,  Risks and benefits discussed,  Surgical consent,  Pre-op evaluation,  At surgeon's request and post-op pain management  Laterality: Left  Prep: chloraprep       Needles:  Injection technique: Single-shot  Needle Type: Echogenic Needle     Needle Length: 4cm  Needle Gauge: 25     Additional Needles:   Procedures:,,,, ultrasound used (permanent image in chart),,    Narrative:  Start time: 06/23/2022 7:15 AM End time: 06/23/2022 7:18 AM Injection made incrementally with aspirations every 5 mL.  Performed by: Personally  Anesthesiologist: Stephanie Coup, MD  Additional Notes: Patient's chart reviewed and they were deemed appropriate candidate for procedure, at surgeon's request. Patient educated about risks, benefits, and alternatives of the block including but not limited to: temporary or permanent nerve damage, bleeding, infection, damage to surround tissues, pneumothorax, hemidiaphragmatic paralysis, unilateral Horner's syndrome, block failure, local anesthetic toxicity. Patient expressed understanding. A formal time-out was conducted consistent with institution rules.  Monitors were applied, and minimal sedation used (see nursing record). The site was prepped with skin prep and allowed to dry, and sterile gloves were used. A high frequency linear ultrasound probe with probe cover was utilized throughout. C5-7 nerve roots located and appeared anatomically normal, local anesthetic injected around them, and echogenic block needle trajectory was monitored throughout. Aspiration performed every 5ml. Lung and blood vessels were avoided. All injections were performed without resistance and free of blood and paresthesias. The patient tolerated the procedure well.  Injectate: 20ml  exparel + 10ml 0.5% bupivacaine
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
## 2022-06-23 NOTE — Anesthesia Procedure Notes (Signed)
Procedure Name: Intubation Date/Time: 06/23/2022 5:37 PM  Performed by: Jaye Beagle, CRNAPre-anesthesia Checklist: Patient identified, Emergency Drugs available, Suction available and Patient being monitored Patient Re-evaluated:Patient Re-evaluated prior to induction Oxygen Delivery Method: Circle system utilized Preoxygenation: Pre-oxygenation with 100% oxygen Induction Type: IV induction Ventilation: Mask ventilation without difficulty Laryngoscope Size: McGraph and 3 Grade View: Grade I Tube type: Oral Tube size: 7.5 mm Number of attempts: 1 Airway Equipment and Method: Stylet and Oral airway Placement Confirmation: ETT inserted through vocal cords under direct vision, positive ETCO2 and breath sounds checked- equal and bilateral Secured at: 22 cm Tube secured with: Tape Dental Injury: Teeth and Oropharynx as per pre-operative assessment
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
## 2022-06-26 ENCOUNTER — Encounter: Payer: Self-pay | Admitting: Orthopedic Surgery

## 2022-09-27 IMAGING — US US SCROTUM W/ DOPPLER COMPLETE
1 series · 14 of 25 positions shown · non-contrast
Comparison: None.

CLINICAL DATA: Right testicle pain since yesterday.

EXAM:
SCROTAL ULTRASOUND
DOPPLER ULTRASOUND OF THE TESTICLES
TECHNIQUE: Complete ultrasound examination of the testicles, epididymis, and
other scrotal structures was performed. Color and spectral Doppler
ultrasound were also utilized to evaluate blood flow to the
testicles.

[Series 1: us scrotum w/doppler · 14 of 89 slices shown]
[im 1/89]
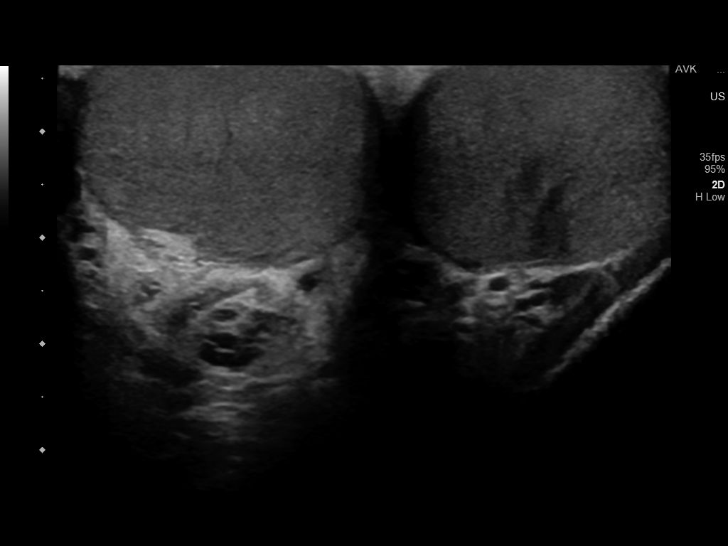
[im 8/89]
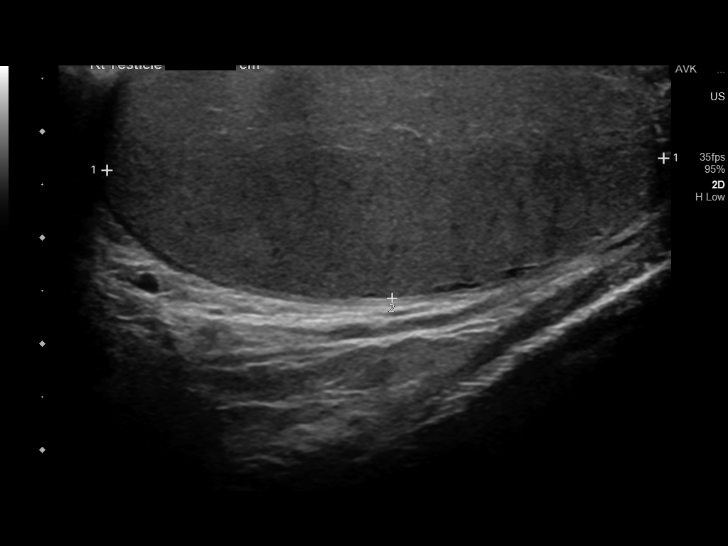
[im 15/89]
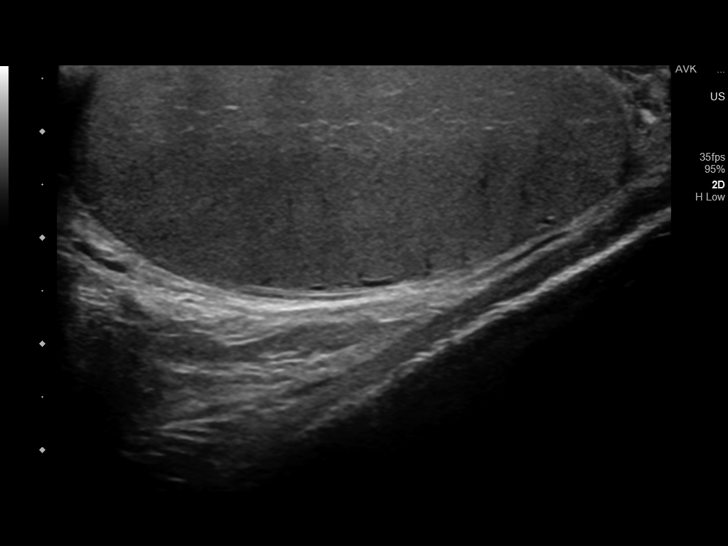
[im 23/89]
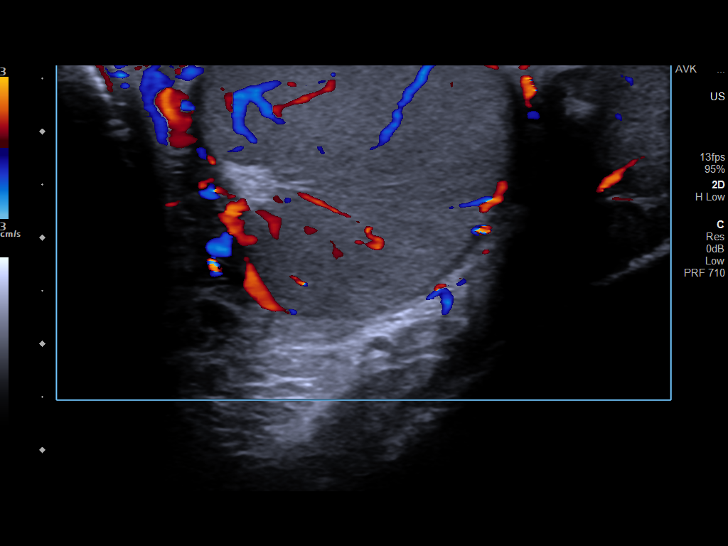
[im 30/89]
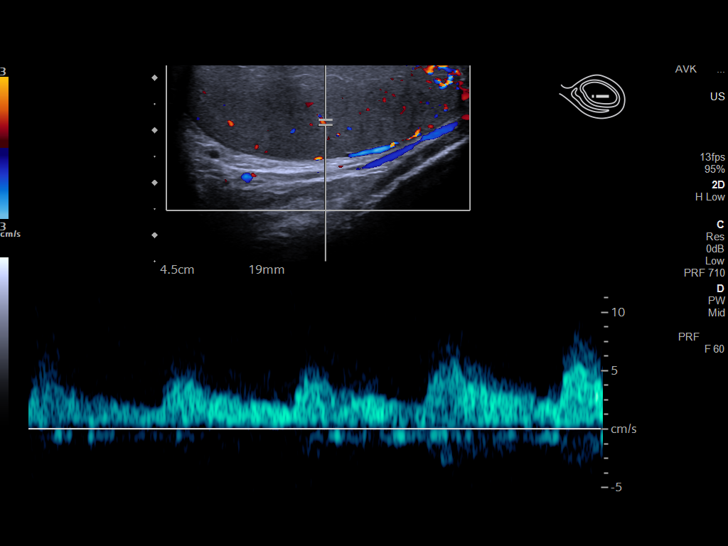
[im 34/89]
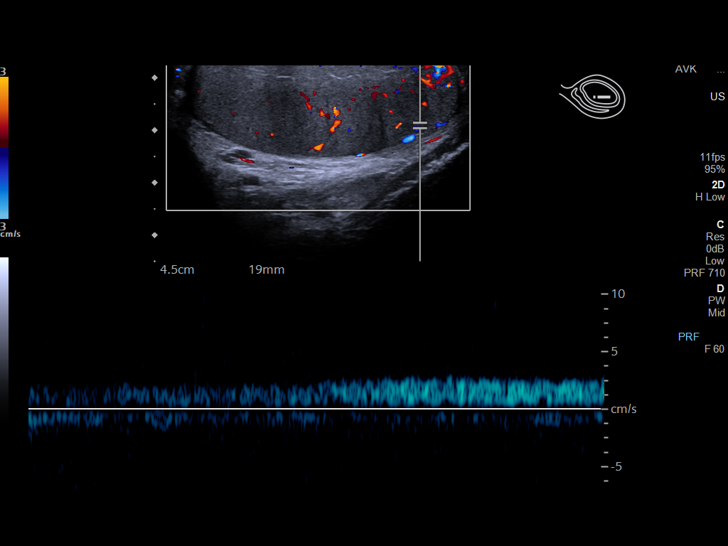
[im 41/89]
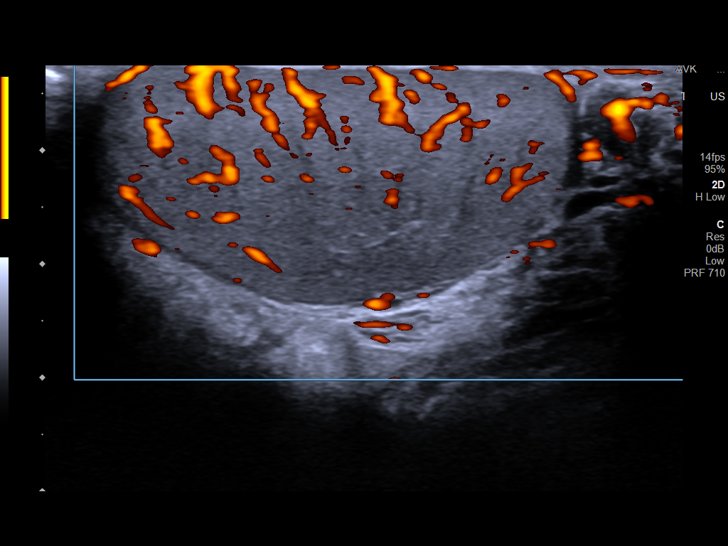
[im 48/89]
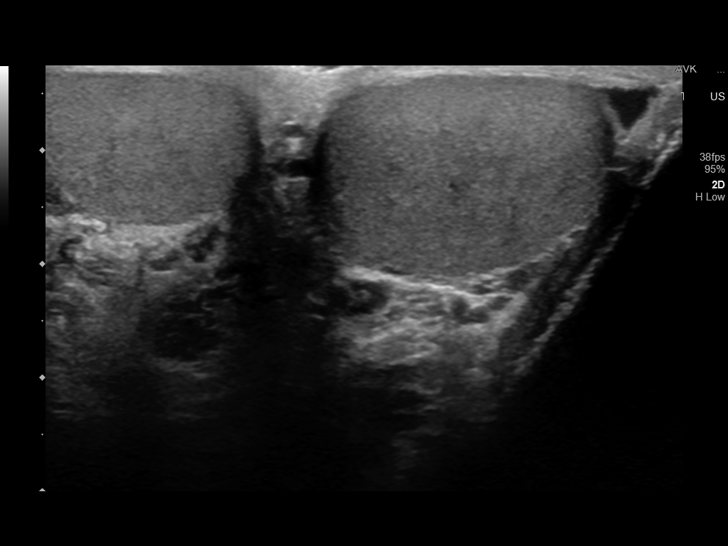
[im 56/89]
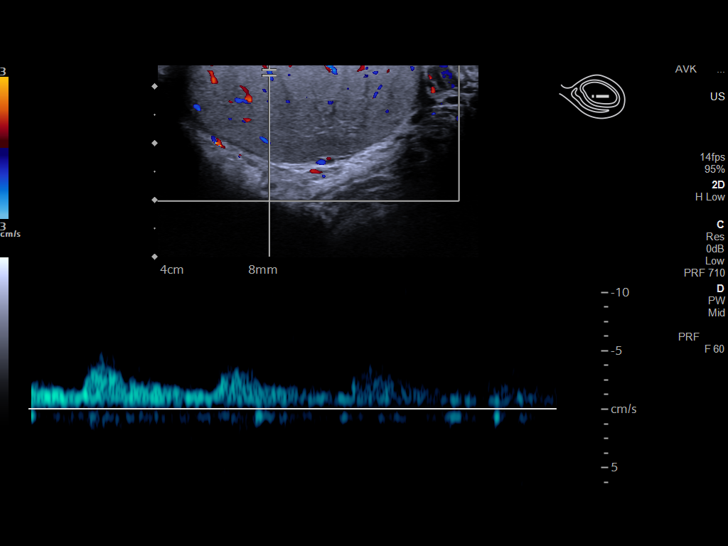
[im 59/89]
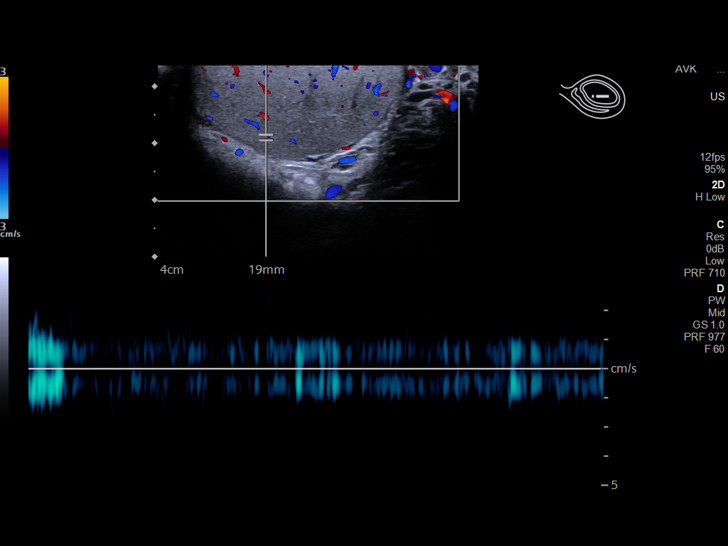
[im 67/89]
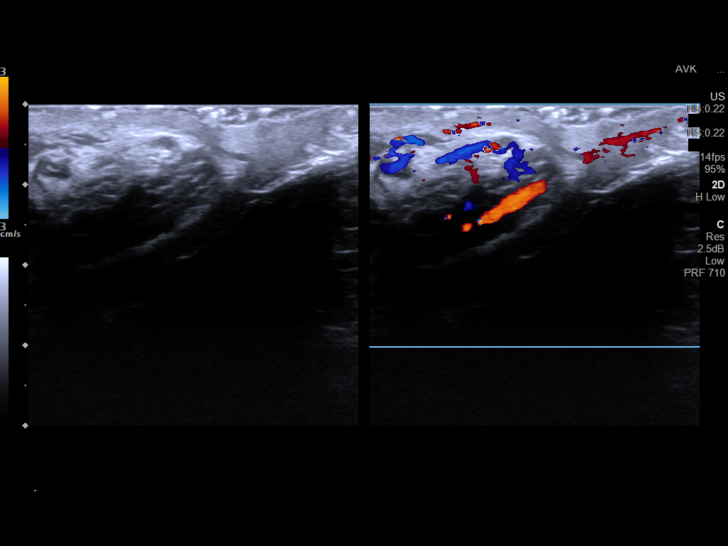
[im 74/89]
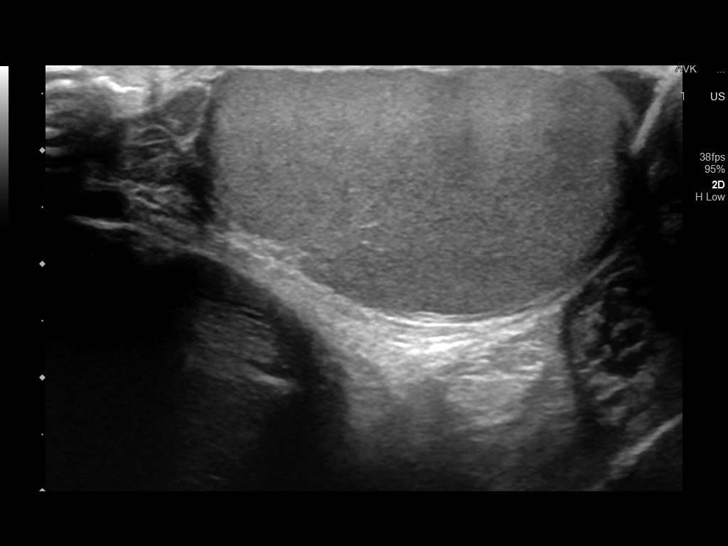
[im 81/89]
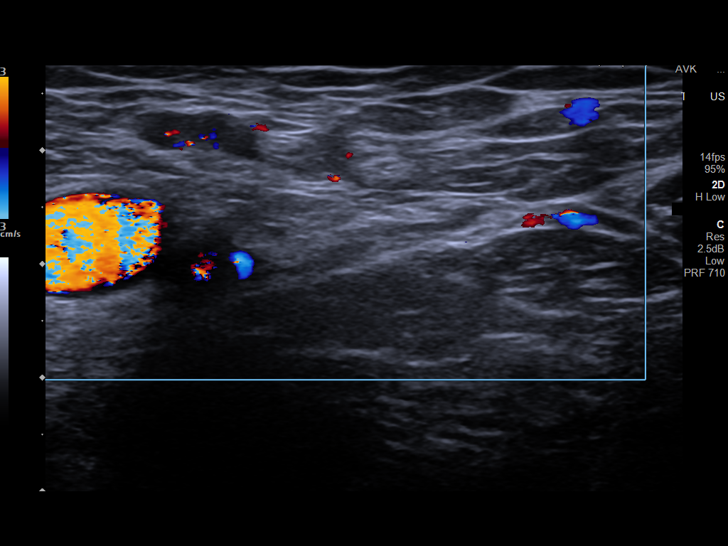
[im 89/89]
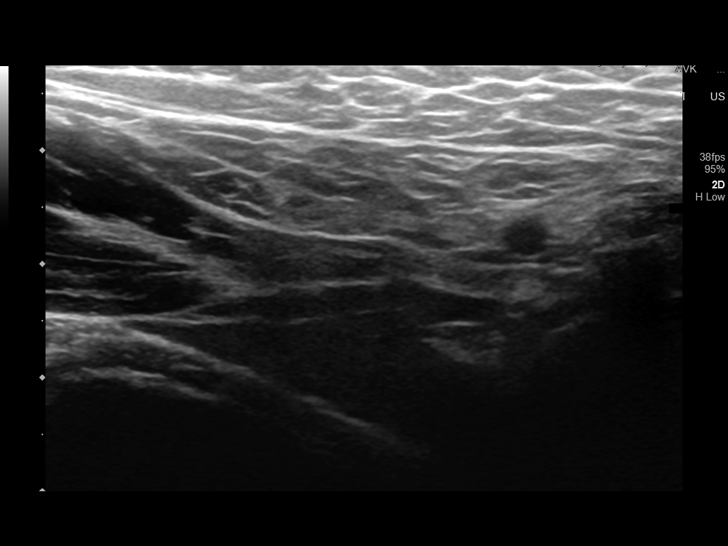

[14 of 25 positions shown; findings below may reference images not displayed]

FINDINGS: Right testicle

Measurements: 5.2 x 2.3 x 2.9 cm. No mass or microlithiasis
visualized.

Left testicle

Measurements: 4.8 x 1 x 2 cm. No mass or microlithiasis visualized.

Right epididymis:  Normal in size and appearance.

Left epididymis:  Normal in size and appearance.

Hydrocele:  None visualized.

Varicocele:  None visualized.

Pulsed Doppler interrogation of both testes demonstrates normal low
resistance arterial and venous waveforms bilaterally.
IMPRESSION: Unremarkable bilateral scrotal ultrasound.

## 2022-09-27 IMAGING — CT CT ABD-PELV W/ CM
2 of 5 series · 16 of 46 positions shown, 18 images · IV contrast (APPLIED)
Comparison: None.

CLINICAL DATA: Right-sided testicular pain.

EXAM:
CT ABDOMEN AND PELVIS WITH CONTRAST
TECHNIQUE: Multidetector CT imaging of the abdomen and pelvis was performed
using the standard protocol following bolus administration of
intravenous contrast.
CONTRAST:  80mL OMNIPAQUE IOHEXOL 350 MG/ML SOLN

[Series 2: routine abd/pel with · axial · 0.79mm/px · z∈[-275,+190]mm · 13 of 103 slices shown, 15 images]
[im 5/103  soft-tissue]
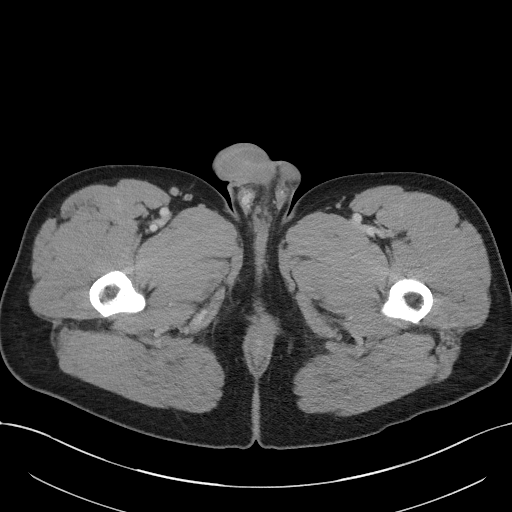
[im 5/103  bone]
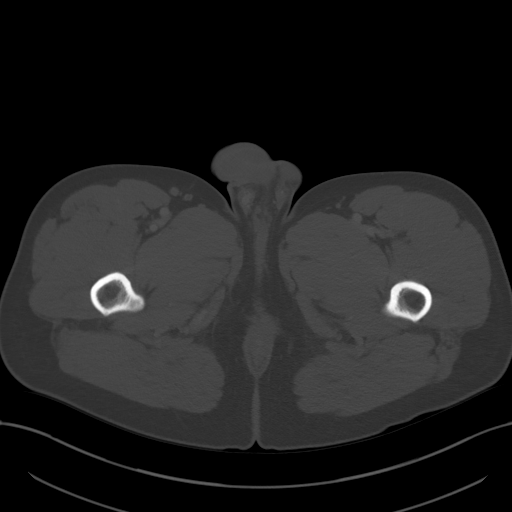
[im 14/103  soft-tissue]
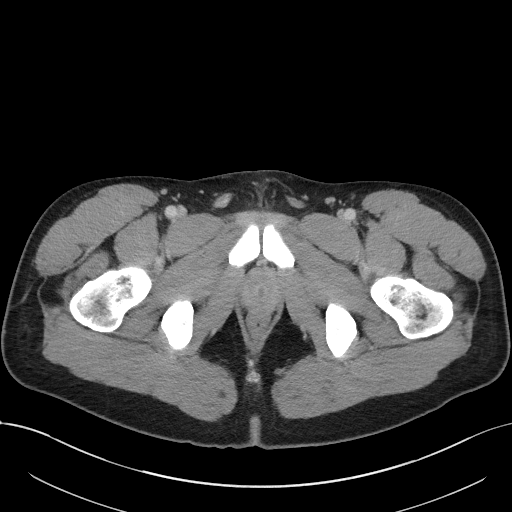
[im 23/103  soft-tissue]
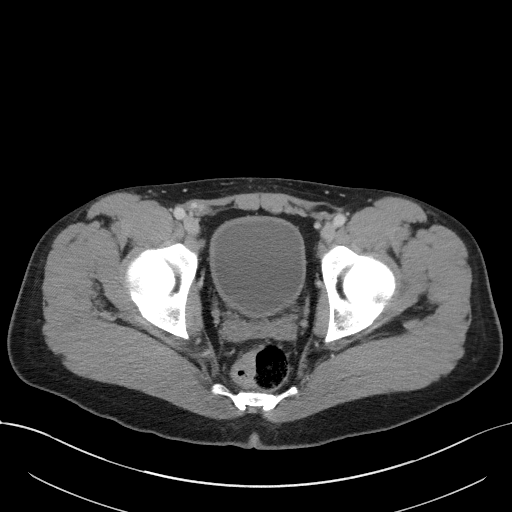
[im 27/103  soft-tissue]
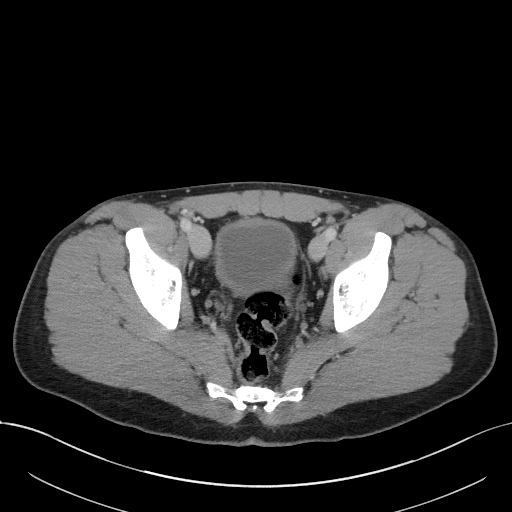
[im 36/103  soft-tissue]
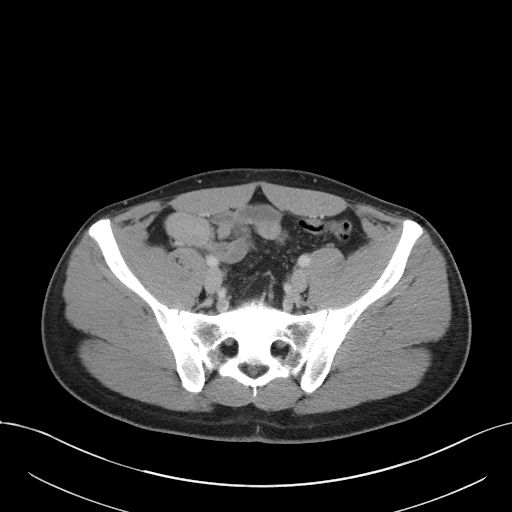
[im 45/103  soft-tissue]
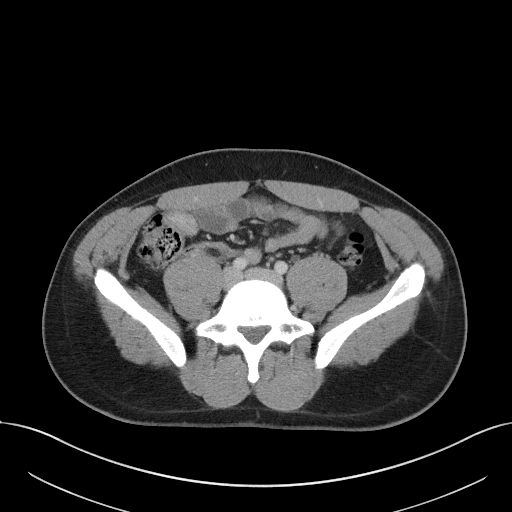
[im 54/103  soft-tissue]
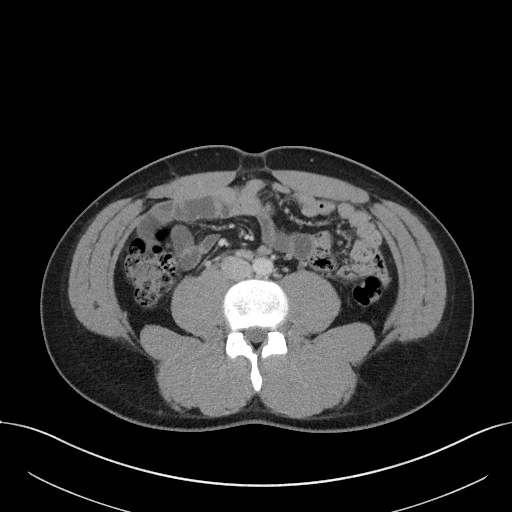
[im 58/103  soft-tissue]
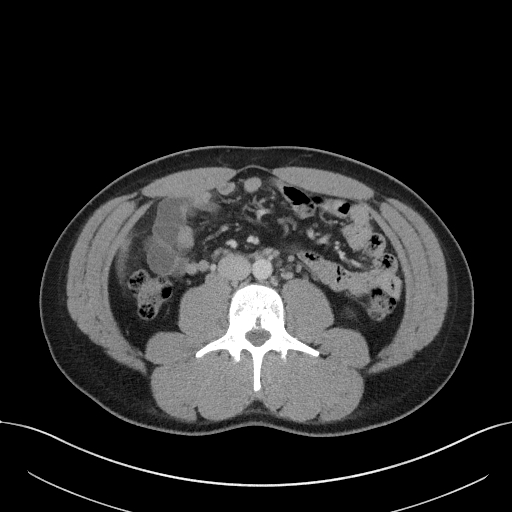
[im 67/103  soft-tissue]
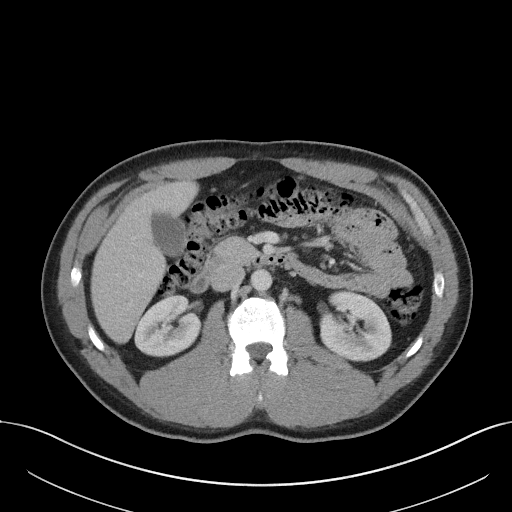
[im 67/103  bone]
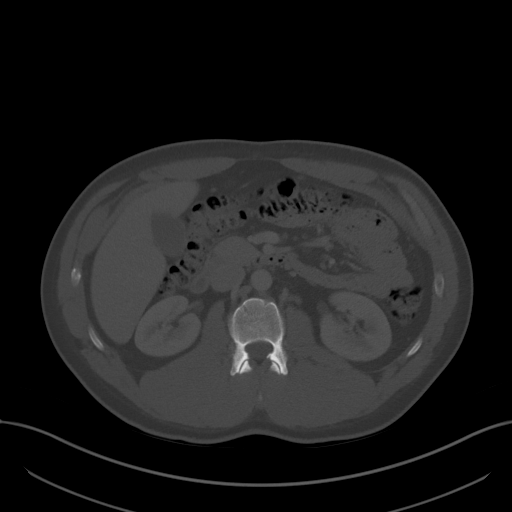
[im 76/103  soft-tissue]
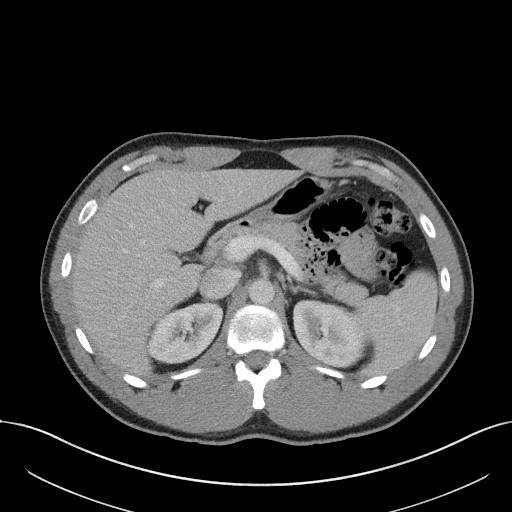
[im 80/103  soft-tissue]
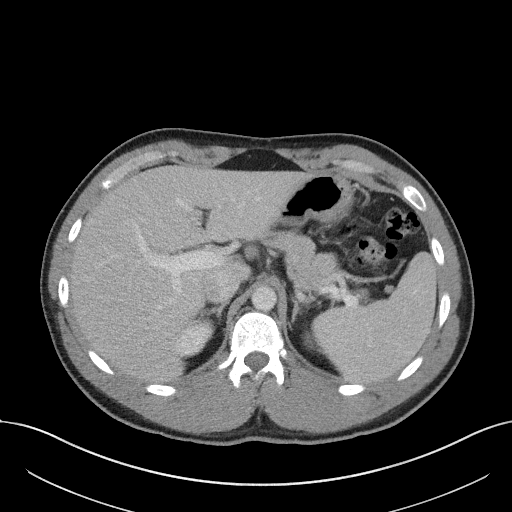
[im 89/103  soft-tissue]
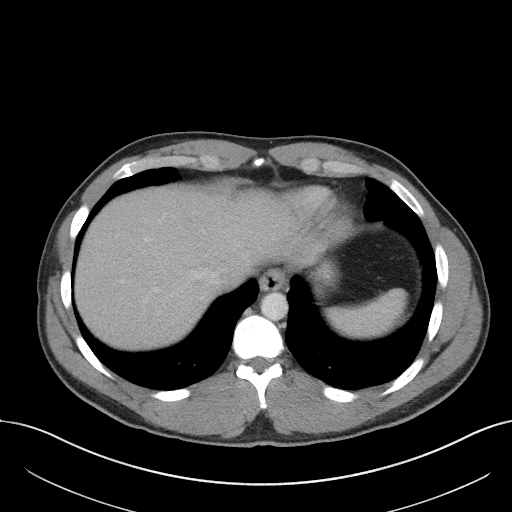
[im 98/103  soft-tissue]
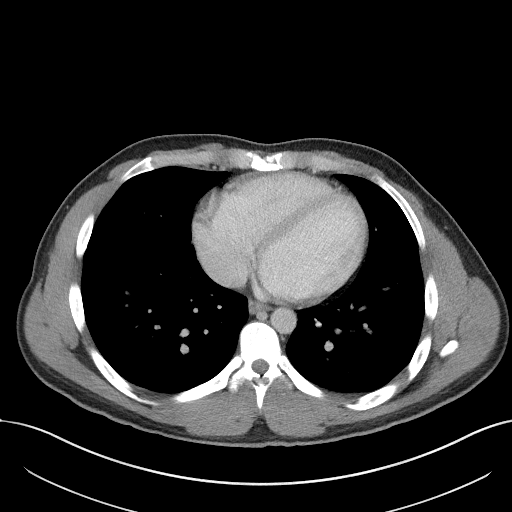

[Series 7: coronal st · coronal · 0.74mm/px · 3 of 85 slices shown]
[im 29/85  soft-tissue]
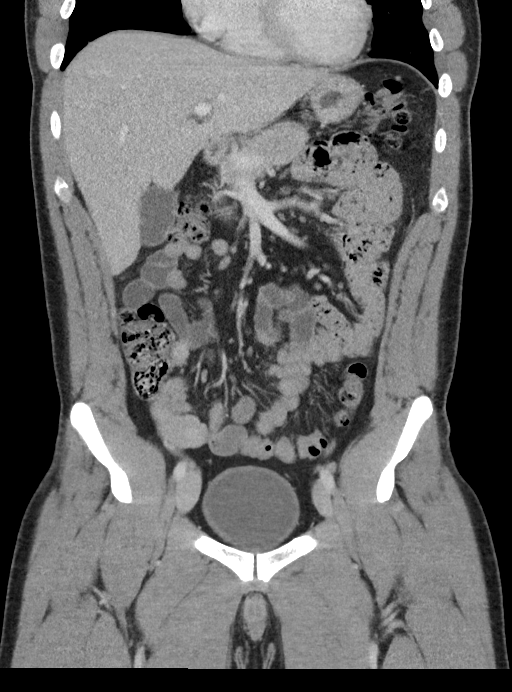
[im 38/85  soft-tissue]
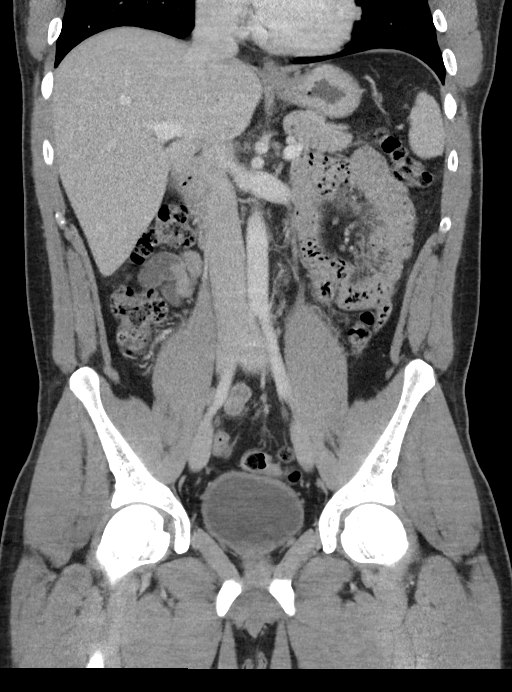
[im 47/85  soft-tissue]
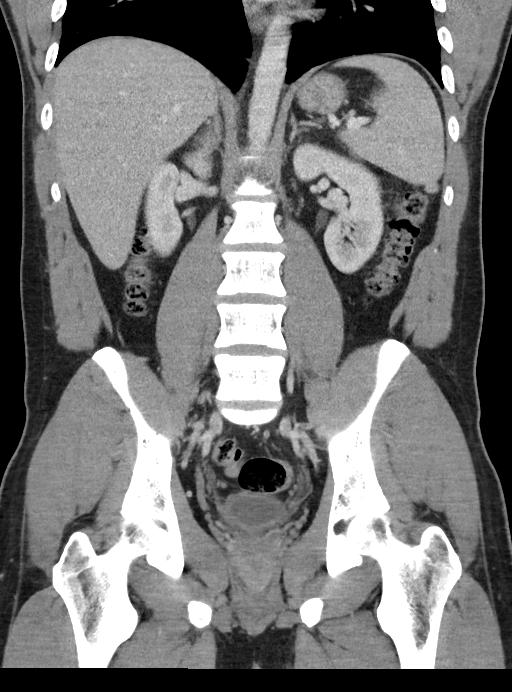

[16 of 46 positions shown; findings below may reference images not displayed]

FINDINGS: Lower chest: No acute abnormality.

Hepatobiliary: No focal liver abnormality is seen. No gallstones,
gallbladder wall thickening, or biliary dilatation.

Pancreas: Unremarkable. No pancreatic ductal dilatation or
surrounding inflammatory changes.

Spleen: Normal in size without focal abnormality.

Adrenals/Urinary Tract: Adrenal glands are unremarkable. Kidneys are
normal, without renal calculi, focal lesion, or hydronephrosis.
Bladder is unremarkable.

Stomach/Bowel: Stomach is within normal limits. Appendix appears
normal. No evidence of bowel wall thickening, distention, or
inflammatory changes.

Vascular/Lymphatic: No significant vascular findings are present. No
enlarged abdominal or pelvic lymph nodes.

Reproductive: Prostate is unremarkable.

Other: No abdominal wall hernia or abnormality. No abdominopelvic
ascites.

Musculoskeletal: No acute or significant osseous findings.
IMPRESSION: No CT evidence of acute intra-abdominal pathology.

## 2022-09-29 IMAGING — US US SCROTUM W/ DOPPLER COMPLETE
1 series · 14 of 25 positions shown · non-contrast
Comparison: None.

CLINICAL DATA: Right testicular pain and swelling

EXAM:
SCROTAL ULTRASOUND
DOPPLER ULTRASOUND OF THE TESTICLES
TECHNIQUE: Complete ultrasound examination of the testicles, epididymis, and
other scrotal structures was performed. Color and spectral Doppler
ultrasound were also utilized to evaluate blood flow to the
testicles.

[Series 1: us scrotum w/doppler · 14 of 156 slices shown]
[im 1/156]
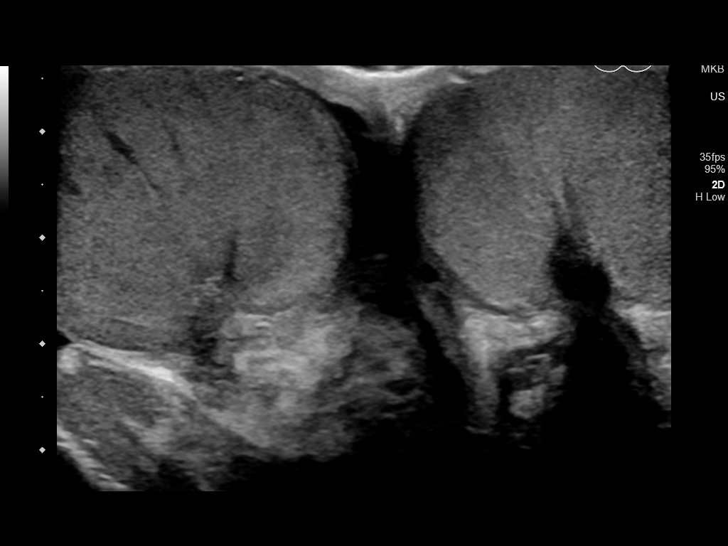
[im 13/156]
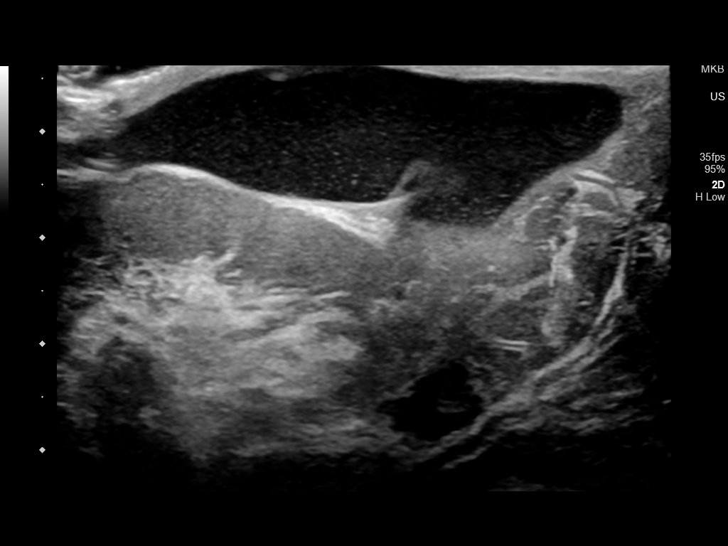
[im 26/156]
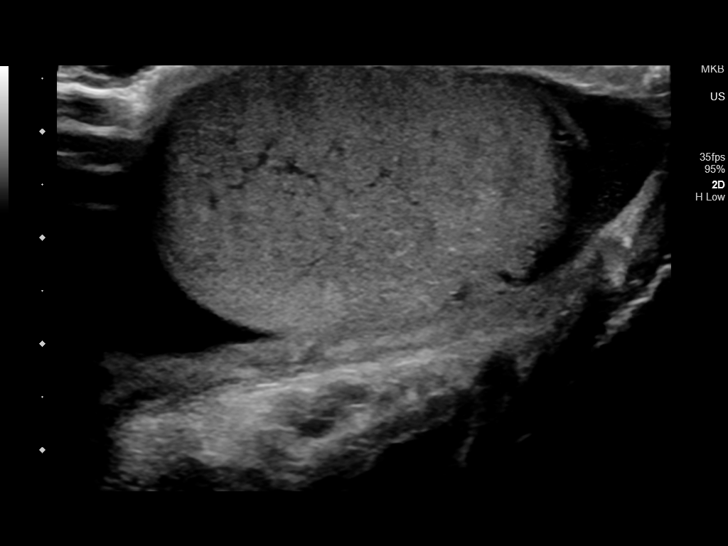
[im 39/156]
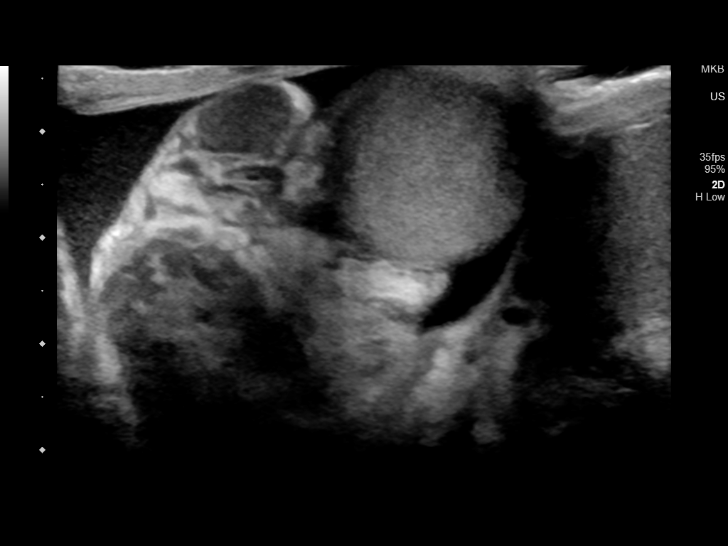
[im 52/156]
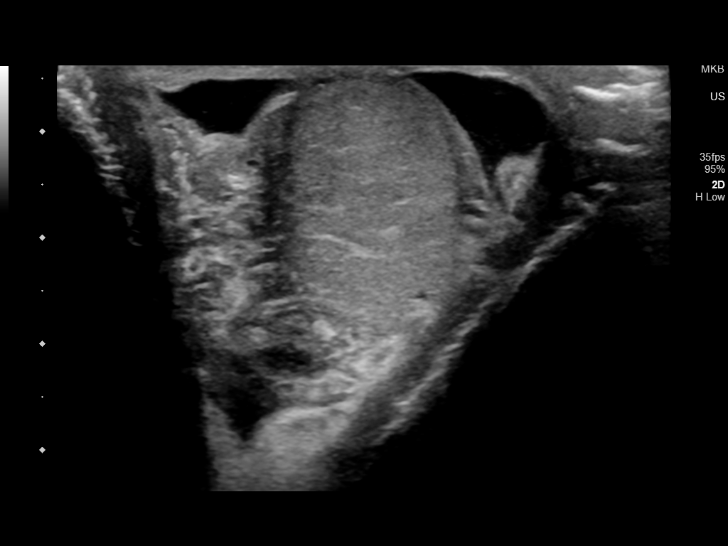
[im 59/156]
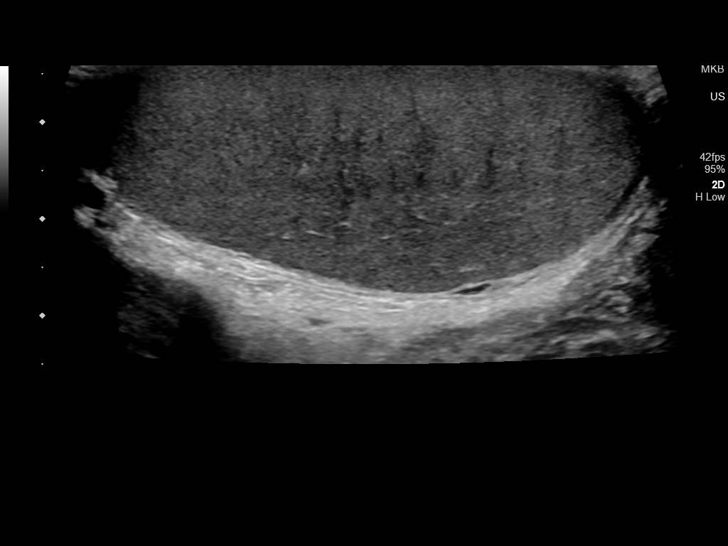
[im 72/156]
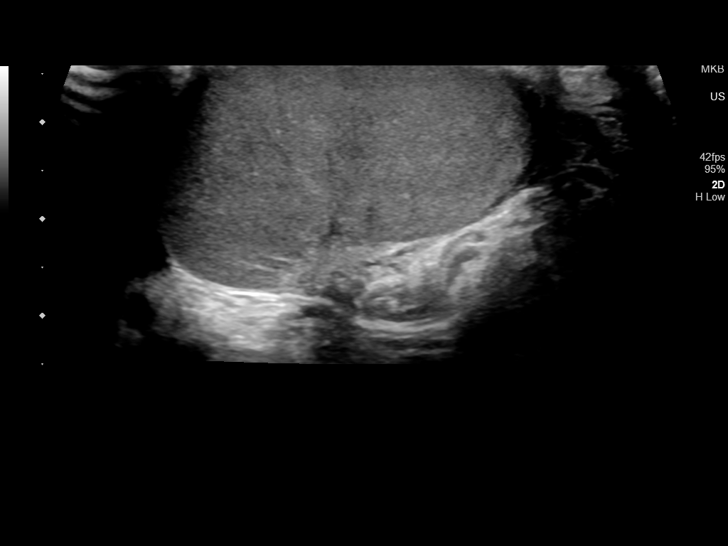
[im 84/156]
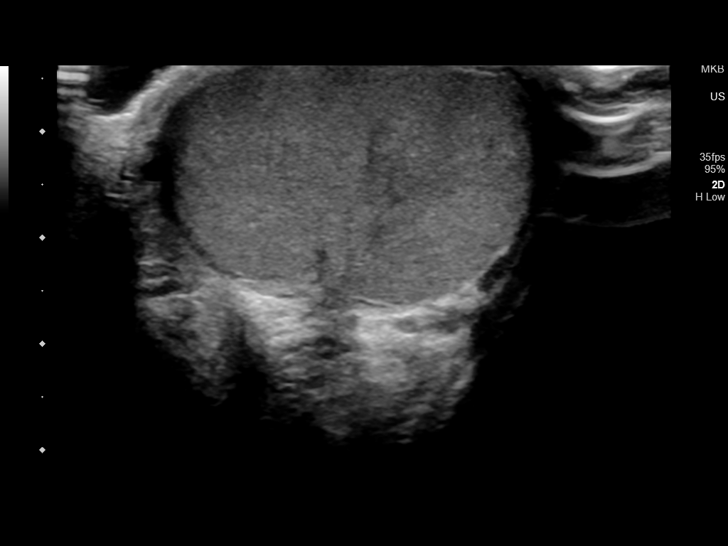
[im 97/156]
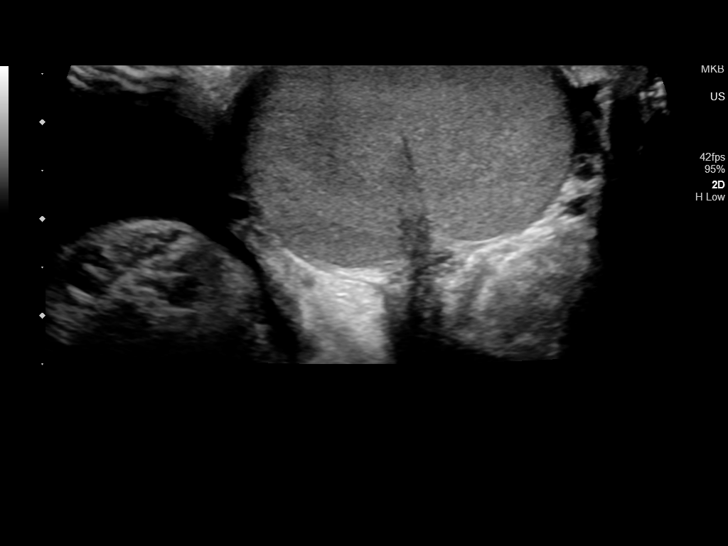
[im 104/156]
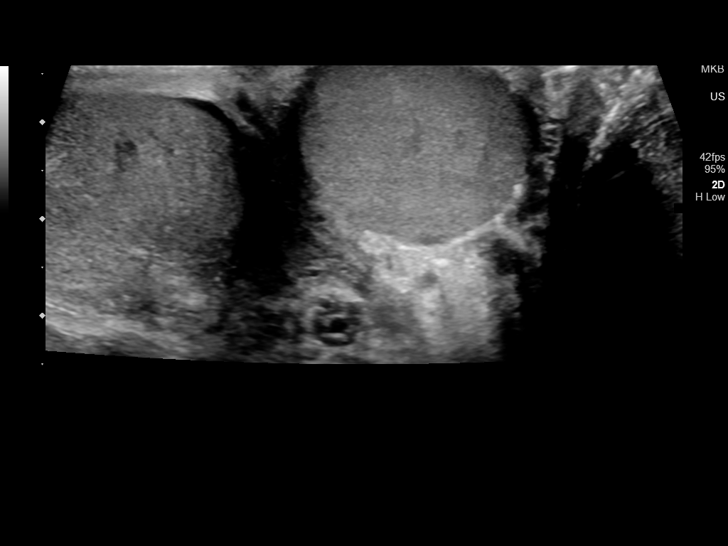
[im 117/156]
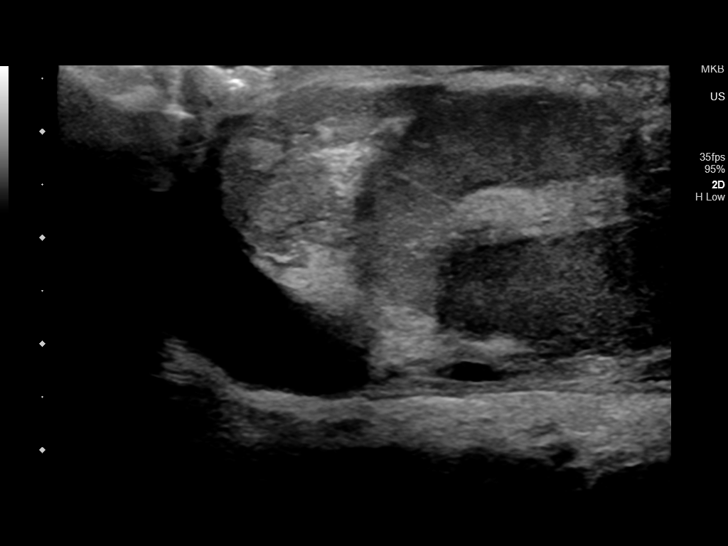
[im 130/156]
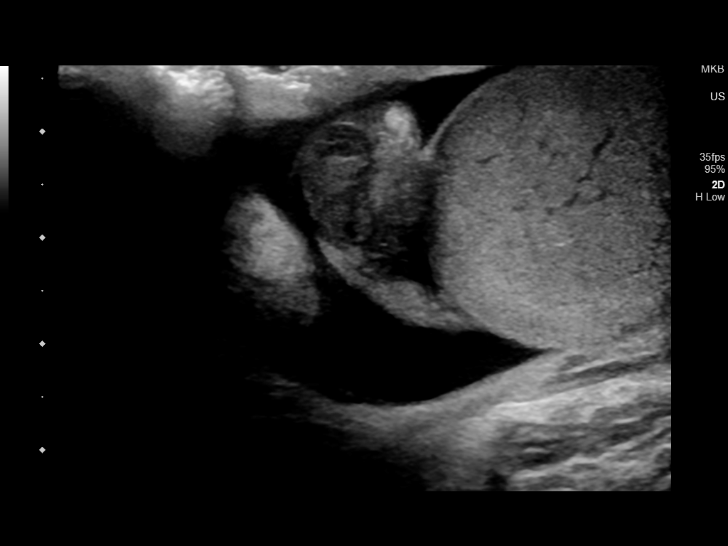
[im 143/156]
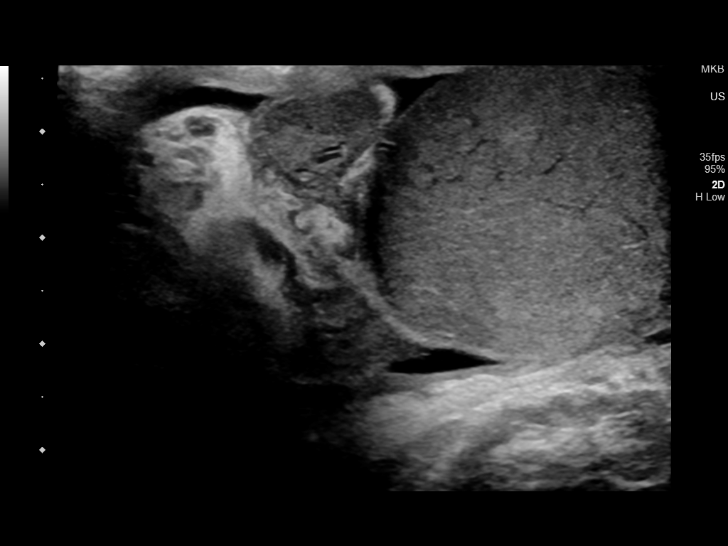
[im 156/156]
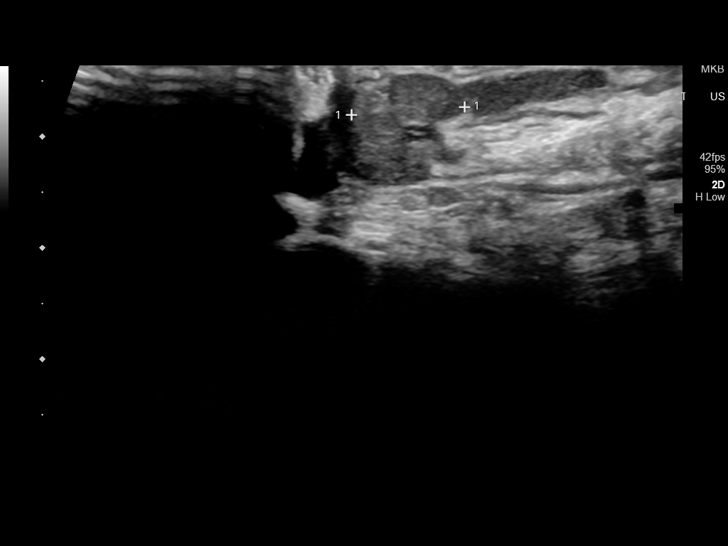

[14 of 25 positions shown; findings below may reference images not displayed]

FINDINGS: Right testicle

Measurements: 5.3 x 3.2 x 2.9 cm. No mass or microlithiasis
visualized. Heterogeneous and hypervascular testicle.

Left testicle

Measurements: 5.5 x 2.5 x 3.3 cm. No mass or microlithiasis
visualized.

Right epididymis:  Enlarged and hypervascular.

Left epididymis:  Normal in size and appearance.

Hydrocele:  Small right hydrocele.

Varicocele:  None visualized.

Pulsed Doppler interrogation of both testes demonstrates normal low
resistance arterial and venous waveforms bilaterally.
IMPRESSION: 1. Heterogeneous, hypervascular right testicle and epididymis with a
small right hydrocele, findings consistent with right
epididymo-orchitis.
2. The left testicle is normal in appearance.

## 2022-10-30 ENCOUNTER — Encounter: Payer: Self-pay | Admitting: Emergency Medicine

## 2022-10-30 ENCOUNTER — Other Ambulatory Visit: Payer: Self-pay

## 2022-10-30 ENCOUNTER — Emergency Department

## 2022-10-30 ENCOUNTER — Ambulatory Visit: Admitting: Internal Medicine

## 2022-10-30 ENCOUNTER — Inpatient Hospital Stay
Admission: EM | Admit: 2022-10-30 | Discharge: 2022-11-01 | DRG: 871 | Disposition: A | Discharge status: Home / Self Care | Attending: Internal Medicine | Admitting: Internal Medicine

## 2022-10-30 DIAGNOSIS — A419 Sepsis, unspecified organism: Secondary | ICD-10-CM | POA: Diagnosis not present

## 2022-10-30 DIAGNOSIS — F17290 Nicotine dependence, other tobacco product, uncomplicated: Secondary | ICD-10-CM | POA: Diagnosis present

## 2022-10-30 DIAGNOSIS — R0603 Acute respiratory distress: Secondary | ICD-10-CM | POA: Diagnosis present

## 2022-10-30 DIAGNOSIS — Z1152 Encounter for screening for COVID-19: Secondary | ICD-10-CM | POA: Diagnosis not present

## 2022-10-30 DIAGNOSIS — J189 Pneumonia, unspecified organism: Principal | ICD-10-CM | POA: Diagnosis present

## 2022-10-30 DIAGNOSIS — Z7289 Other problems related to lifestyle: Secondary | ICD-10-CM | POA: Diagnosis not present

## 2022-10-30 LAB — RESPIRATORY PANEL BY PCR

## 2022-10-30 LAB — RESP PANEL BY RT-PCR (RSV, FLU A&B, COVID)  RVPGX2
Influenza A by PCR: NEGATIVE
Influenza B by PCR: NEGATIVE
Resp Syncytial Virus by PCR: NEGATIVE
SARS Coronavirus 2 by RT PCR: NEGATIVE

## 2022-10-30 LAB — PROTIME-INR
INR: 1.3 — ABNORMAL HIGH (ref 0.8–1.2)
Prothrombin Time: 15.9 s — ABNORMAL HIGH (ref 11.4–15.2)

## 2022-10-30 LAB — LACTIC ACID, PLASMA
Lactic Acid, Venous: 1.8 mmol/L (ref 0.5–1.9)
Lactic Acid, Venous: 1 mmol/L (ref 0.5–1.9)

## 2022-10-30 LAB — CBC
HCT: 44.9 % (ref 39.0–52.0)
Hemoglobin: 15.2 g/dL (ref 13.0–17.0)
MCH: 29 pg (ref 26.0–34.0)
MCHC: 33.9 g/dL (ref 30.0–36.0)
MCV: 85.7 fL (ref 80.0–100.0)
Platelets: 204 10*3/uL (ref 150–400)
RBC: 5.24 MIL/uL (ref 4.22–5.81)
RDW: 12.1 % (ref 11.5–15.5)
WBC: 8.2 10*3/uL (ref 4.0–10.5)
nRBC: 0 % (ref 0.0–0.2)

## 2022-10-30 LAB — TROPONIN I (HIGH SENSITIVITY)
Troponin I (High Sensitivity): 4 ng/L (ref <18)
Troponin I (High Sensitivity): 5 ng/L (ref <18)

## 2022-10-30 LAB — PROCALCITONIN: Procalcitonin: 0.5 ng/mL

## 2022-10-30 LAB — APTT: aPTT: 35 s (ref 24–36)

## 2022-10-30 LAB — COMPREHENSIVE METABOLIC PANEL
ALT: 15 U/L (ref 0–44)
AST: 18 U/L (ref 15–41)
Albumin: 4.2 g/dL (ref 3.5–5.0)
Alkaline Phosphatase: 42 U/L (ref 38–126)
Anion gap: 11 (ref 5–15)
BUN: 10 mg/dL (ref 6–20)
CO2: 22 mmol/L (ref 22–32)
Calcium: 9 mg/dL (ref 8.9–10.3)
Chloride: 102 mmol/L (ref 98–111)
Creatinine, Ser: 1.21 mg/dL (ref 0.61–1.24)
GFR, Estimated: >60 mL/min (ref >60)
Glucose, Bld: 104 mg/dL — ABNORMAL HIGH (ref 70–99)
Potassium: 3.8 mmol/L (ref 3.5–5.1)
Sodium: 135 mmol/L (ref 135–145)
Total Bilirubin: 0.9 mg/dL (ref 0.3–1.2)
Total Protein: 8 g/dL (ref 6.5–8.1)

## 2022-10-30 LAB — EXPECTORATED SPUTUM ASSESSMENT W GRAM STAIN, RFLX TO RESP C

## 2022-10-30 LAB — BASIC METABOLIC PANEL
Anion gap: 10 (ref 5–15)
BUN: 10 mg/dL (ref 6–20)
CO2: 23 mmol/L (ref 22–32)
Calcium: 9 mg/dL (ref 8.9–10.3)
Chloride: 103 mmol/L (ref 98–111)
Creatinine, Ser: 1.22 mg/dL (ref 0.61–1.24)
GFR, Estimated: >60 mL/min (ref >60)
Glucose, Bld: 112 mg/dL — ABNORMAL HIGH (ref 70–99)
Potassium: 3.7 mmol/L (ref 3.5–5.1)
Sodium: 136 mmol/L (ref 135–145)

## 2022-10-30 LAB — BLOOD GAS, VENOUS
Acid-Base Excess: 0.8 mmol/L (ref 0.0–2.0)
Bicarbonate: 24.2 mmol/L (ref 20.0–28.0)
O2 Saturation: 63.8 %
Patient temperature: 37
pCO2, Ven: 34 mm[Hg] — ABNORMAL LOW (ref 44–60)
pH, Ven: 7.46 — ABNORMAL HIGH (ref 7.25–7.43)
pO2, Ven: 37 mm[Hg] (ref 32–45)

## 2022-10-30 LAB — HIV ANTIBODY (ROUTINE TESTING W REFLEX): HIV Screen 4th Generation wRfx: NONREACTIVE

## 2022-10-30 LAB — STREP PNEUMONIAE URINARY ANTIGEN: Strep Pneumo Urinary Antigen: NEGATIVE

## 2022-10-30 LAB — BRAIN NATRIURETIC PEPTIDE: B Natriuretic Peptide: 20.9 pg/mL (ref 0.0–100.0)

## 2022-10-30 MED ORDER — ONDANSETRON HCL 4 MG/2ML IJ SOLN: 4.0000 mg | Freq: Four times a day (QID) | INTRAMUSCULAR | Status: DC | PRN

## 2022-10-30 MED ORDER — NICOTINE 7 MG/24HR TD PT24
7.0000 mg | MEDICATED_PATCH | Freq: Every day | TRANSDERMAL | Status: DC
  Filled 2022-10-30 (×3): qty 1

## 2022-10-30 MED ORDER — ORAL CARE MOUTH RINSE: 15.0000 mL | OROMUCOSAL | Status: DC | PRN

## 2022-10-30 MED ORDER — SODIUM CHLORIDE 0.9 % IV SOLN
500.0000 mg | INTRAVENOUS | Status: DC
  Administered 2022-10-31 – 2022-11-01 (×2): 500 mg via INTRAVENOUS
  Filled 2022-10-30 (×2): qty 5

## 2022-10-30 MED ORDER — IBUPROFEN 600 MG PO TABS
600.0000 mg | ORAL_TABLET | Freq: Once | ORAL | Status: AC
  Administered 2022-10-30: 600 mg via ORAL
  Filled 2022-10-30: qty 1

## 2022-10-30 MED ORDER — IBUPROFEN 400 MG PO TABS
600.0000 mg | ORAL_TABLET | Freq: Four times a day (QID) | ORAL | Status: DC | PRN
  Administered 2022-10-30 – 2022-10-31 (×2): 600 mg via ORAL
  Filled 2022-10-30 (×2): qty 2

## 2022-10-30 MED ORDER — IBUPROFEN 400 MG PO TABS: 600.0000 mg | ORAL_TABLET | Freq: Four times a day (QID) | ORAL | Status: DC | PRN

## 2022-10-30 MED ORDER — LACTATED RINGERS IV SOLN: INTRAVENOUS | Status: DC

## 2022-10-30 MED ORDER — SODIUM CHLORIDE 0.9 % IV SOLN
2.0000 g | INTRAVENOUS | Status: DC
  Administered 2022-10-31 – 2022-11-01 (×2): 2 g via INTRAVENOUS
  Filled 2022-10-30 (×2): qty 20

## 2022-10-30 MED ORDER — LACTATED RINGERS IV SOLN
150.0000 mL/h | INTRAVENOUS | Status: AC
  Administered 2022-10-30 (×3): 150 mL/h via INTRAVENOUS

## 2022-10-30 MED ORDER — SODIUM CHLORIDE 0.9 % IV SOLN
2.0000 g | INTRAVENOUS | Status: DC
  Administered 2022-10-30: 2 g via INTRAVENOUS
  Filled 2022-10-30: qty 20

## 2022-10-30 MED ORDER — ONDANSETRON HCL 4 MG PO TABS: 4.0000 mg | ORAL_TABLET | Freq: Four times a day (QID) | ORAL | Status: DC | PRN

## 2022-10-30 MED ORDER — ENOXAPARIN SODIUM 40 MG/0.4ML IJ SOSY
40.0000 mg | PREFILLED_SYRINGE | INTRAMUSCULAR | Status: DC
  Filled 2022-10-30: qty 0.4

## 2022-10-30 MED ORDER — SODIUM CHLORIDE 0.9 % IV SOLN
500.0000 mg | INTRAVENOUS | Status: DC
  Administered 2022-10-30: 500 mg via INTRAVENOUS
  Filled 2022-10-30: qty 5

## 2022-10-30 MED ORDER — ACETAMINOPHEN 325 MG PO TABS
650.0000 mg | ORAL_TABLET | Freq: Four times a day (QID) | ORAL | Status: DC | PRN
  Administered 2022-10-30 – 2022-11-01 (×2): 650 mg via ORAL
  Filled 2022-10-30 (×2): qty 2

## 2022-10-30 MED ORDER — ACETAMINOPHEN 500 MG PO TABS: 1000.0000 mg | ORAL_TABLET | Freq: Once | ORAL | Status: DC

## 2022-10-30 MED ORDER — LACTATED RINGERS IV BOLUS (SEPSIS)
1000.0000 mL | Freq: Once | INTRAVENOUS | Status: AC
  Administered 2022-10-30: 1000 mL via INTRAVENOUS

## 2022-10-30 NOTE — ED Notes (Signed)
Pt stood at bedside and used the Urinal.

## 2022-10-30 NOTE — Assessment & Plan Note (Signed)
Meeting sepsis criteria with Tmax of 103.2, heart rate 100s Noted right lower lobe pneumonia on chest x-ray COVID flu and RSV negative White count 8.2 Lactate 1.8. Procalcitonin pending Pancultured IV Rocephin azithromycin for infectious coverage Monitor

## 2022-10-30 NOTE — ED Triage Notes (Addendum)
Pt in via [facility], complaints of cough, fever x 3 days, seeing Urgent Care yesterday; negative for Covid/Flu.  Given RX cough syrup but without any relief.    Per EMS: CBG 145 Temp 102.5 94% RA, arrives on 2L [place]  18G LAC 640mg  Tylenol  Patient tachypneic on arrival, persistent cough present; able to slow breathing down w/ coaching.

## 2022-10-30 NOTE — ED Provider Notes (Signed)
[facility] Provider Note   Event Date/Time   First MD Initiated Contact with Patient 10/30/22 1052     (approximate) History  Fever and Cough  HPI Alexander Gallagher is a 27 y.o. male with a stated past medical history of right clavicular fracture who presents for fever and cough for the last 3 days.  Patient was seen by urgent care yesterday, tested negative for COVID/flu, and given a prescription for cough syrup.  Patient states that since that time his symptoms have worsened including worsening shortness of breath and respiratory distress.  Patient also endorses continued fever to 102 at home has taken Tylenol prior to arrival.  Patient extremely tachypneic on arrival and placed on 2 L nasal cannula ROS: Patient currently denies any vision changes, tinnitus, difficulty speaking, facial droop, sore throat, chest pain, abdominal pain, nausea/vomiting/diarrhea, dysuria, or weakness/numbness/paresthesias in any extremity   Physical Exam  Triage Vital Signs: ED Triage Vitals  Encounter Vitals Group     BP 10/30/22 0954 139/79     Systolic BP Percentile --      Diastolic BP Percentile --      Pulse Rate 10/30/22 0954 (!) 114     Resp 10/30/22 0954 20     Temp 10/30/22 0954 (!) 102.8 F (39.3 C)     Temp src --      SpO2 10/30/22 0954 94 %     Weight 10/30/22 0958 208 lb (94.3 kg)     Height 10/30/22 0958 5\' 10"  (1.778 m)     Head Circumference --      Peak Flow --      Pain Score 10/30/22 0958 0     Pain Loc --      Pain Education --      Exclude from Growth Chart --    Most recent vital signs: Vitals:   10/30/22 1006 10/30/22 1121  BP:    Pulse:    Resp: (!) 32   Temp:  (!) 103.2 F (39.6 C)  SpO2:     General: Awake, oriented x4. CV:  Good peripheral perfusion.  Resp:  Increased effort.  Decreased breath sounds over right lower lung field Abd:  No distention.  Other:  Young adult overweight Caucasian male resting in stretcher in mild respiratory  distress ED Results / Procedures / Treatments  Labs (all labs ordered are listed, but only abnormal results are displayed) Labs Reviewed  BASIC METABOLIC PANEL - Abnormal; Notable for the following components:      Result Value   Glucose, Bld 112 (*)    All other components within normal limits  RESP PANEL BY RT-PCR (RSV, FLU A&B, COVID)  RVPGX2  CULTURE, BLOOD (ROUTINE X 2)  CULTURE, BLOOD (ROUTINE X 2)  EXPECTORATED SPUTUM ASSESSMENT W GRAM STAIN, RFLX TO RESP C  CBC  LACTIC ACID, PLASMA  LACTIC ACID, PLASMA  COMPREHENSIVE METABOLIC PANEL  PROTIME-INR  APTT  BRAIN NATRIURETIC PEPTIDE  BLOOD GAS, VENOUS  TROPONIN I (HIGH SENSITIVITY)   EKG ED ECG REPORT I, Merwyn Katos, the attending physician, personally viewed and interpreted this ECG. Date: 10/30/2022 EKG Time: 0958 Rate: 116 Rhythm: Tachycardic sinus rhythm QRS Axis: normal Intervals: normal ST/T Wave abnormalities: normal Narrative Interpretation: Tachycardic sinus rhythm.  No evidence of acute ischemia RADIOLOGY ED MD interpretation: 2 view chest x-ray shows right lower lobe airspace disease consistent with pneumonia -Agree with radiology assessment Official radiology report(s): DG Chest 2 View  Result Date: 10/30/2022 CLINICAL DATA:  Cough and fever. EXAM: CHEST - 2 VIEW COMPARISON:  None Available. FINDINGS: Right lower lobe airspace disease consistent with pneumonia. Minor atelectasis at the left lung base. The heart is normal in size. There may be a small right pleural effusion. No pulmonary edema. No pneumothorax. No acute osseous findings. IMPRESSION: Right lower lobe airspace disease consistent with pneumonia. Possible small right pleural effusion. Electronically Signed   By: Narda Rutherford M.D.   On: 10/30/2022 11:21   PROCEDURES: Critical Care performed: Yes, see critical care procedure note(s) .1-3 Lead EKG Interpretation  Performed by: Merwyn Katos, MD Authorized by: Merwyn Katos, MD      Interpretation: abnormal     ECG rate:  115   ECG rate assessment: tachycardic     Rhythm: sinus tachycardia     Ectopy: none     Conduction: normal   CRITICAL CARE Performed by: Merwyn Katos  Total critical care time: 33 minutes  Critical care time was exclusive of separately billable procedures and treating other patients.  Critical care was necessary to treat or prevent imminent or life-threatening deterioration.  Critical care was time spent personally by me on the following activities: development of treatment plan with patient and/or surrogate as well as nursing, discussions with consultants, evaluation of patient's response to treatment, examination of patient, obtaining history from patient or surrogate, ordering and performing treatments and interventions, ordering and review of laboratory studies, ordering and review of radiographic studies, pulse oximetry and re-evaluation of patient's condition.  MEDICATIONS ORDERED IN ED: Medications  lactated ringers infusion (has no administration in time range)  lactated ringers bolus 1,000 mL (1,000 mLs Intravenous New Bag/Given 10/30/22 1119)  cefTRIAXone (ROCEPHIN) 2 g in sodium chloride 0.9 % 100 mL IVPB (2 g Intravenous New Bag/Given 10/30/22 1113)  azithromycin (ZITHROMAX) 500 mg in sodium chloride 0.9 % 250 mL IVPB (has no administration in time range)  ibuprofen (ADVIL) tablet 600 mg (has no administration in time range)   IMPRESSION / MDM / ASSESSMENT AND PLAN / ED COURSE  I reviewed the triage vital signs and the nursing notes.                             The patient is on the cardiac monitor to evaluate for evidence of arrhythmia and/or significant heart rate changes. Patient's presentation is most consistent with acute presentation with potential threat to life or bodily function.  Presents with shortness of breath, cough, and malaise concerning for pneumonia.  DDx: PE, COPD exacerbation, Pneumothorax, TB, Atypical ACS,  Esophageal Rupture, Toxic Exposure, Foreign Body Airway Obstruction.  Workup: CXR CBC, CMP, lactate, troponin  Given History, Exam, and Workup presentation most consistent with pneumonia.  Findings: Right lower lobe pneumonia  Tx: Ceftriaxone 1g IV Azithromycin 500mg  IV  1136 Reassessment: As patient is continuing to require supplemental oxygenation for acute respiratory distress, patient will require admission to the internal medicine service for further evaluation and management  Disposition: Admit   FINAL CLINICAL IMPRESSION(S) / ED DIAGNOSES   Final diagnoses:  Pneumonia of right lower lobe due to infectious organism  Respiratory distress   Rx / DC Orders   ED Discharge Orders     None      Note:  This document was prepared using Dragon voice recognition software and may include unintentional dictation errors.   Merwyn Katos, MD 10/30/22 [phone]

## 2022-10-30 NOTE — Consult Note (Signed)
CODE SEPSIS - PHARMACY COMMUNICATION  **Broad-spectrum antimicrobials should be administered within one hour of sepsis diagnosis**  Time Code Sepsis call or page was received: 1058  Antibiotics ordered: Ceftriaxone, Azithromycin  Time of first antibiotic administration: 1113  Additional action taken by pharmacy: N/A  If necessary, name of provider/nurse contacted: N/A    Will M. Dareen Piano, PharmD Clinical Pharmacist 10/30/2022 11:02 AM

## 2022-10-30 NOTE — Assessment & Plan Note (Signed)
Regular vape use Discussed cessation Nicotine patch

## 2022-10-30 NOTE — H&P (Addendum)
History and Physical    Patient: Alexander Gallagher ZOX:[id number] DOB: November 01, 1995 DOA: 10/30/2022 DOS: the patient was seen and examined on 10/30/2022 PCP: Sherlene Shams, MD  Patient coming from: Home  Chief Complaint:  Chief Complaint  Patient presents with   Fever   Cough   HPI: Alexander Gallagher is a 27 y.o. male with medical history significant of no significant prior medical issues presenting with sepsis and pneumonia.  Patient reports increased work of breathing, cough and shortness of breath since roughly 4 to 5 days ago.  No recent sick contacts though does report taking care of his son who had strep throat roughly 4 to 6 weeks ago.  No abdominal pain.  Mild nausea.  No focal hemiparesis or confusion.  Denies tobacco use but does vape on a regular basis.  Occasional beer intake especially on the weekends.  Denies any prior episodes like this in the past. Presented to the ER Tmax of 103.2, heart rate 100s, respirations in the 30s, BP stable, satting in the mid 90s on 2 L nasal cannula.  White count 8.2, hemoglobin 15, platelets 204, COVID flu and RSV negative, creatinine 1.21, chest x-ray with right lower lobe pneumonia. Review of Systems: As mentioned in the history of present illness. All other systems reviewed and are negative. Past Medical History:  Diagnosis Date   Fracture of right clavicle 05/07/2013   Labral tear of shoulder    left   Past Surgical History:  Procedure Laterality Date   SHOULDER ARTHROSCOPY WITH LABRAL REPAIR Left 06/23/2022   Procedure: Left shoulder arthroscopic posterior labral repair and capsulorrhaphy with possible arthroscopic biceps tenodesis;  Surgeon: Signa Kell, MD;  Location: [facility] ORS;  Service: Orthopedics;  Laterality: Left;   WISDOM TOOTH EXTRACTION     Social History:  reports that he has been smoking e-cigarettes. He has never used smokeless tobacco. He reports current alcohol use of about 3.0 standard drinks of alcohol per week. He reports  that he does not use drugs.  No Known Allergies  Family History  Problem Relation Age of Onset   Cancer Mother 47       breast cancer   Migraines Maternal Grandmother    Cancer Paternal Aunt 3       breast cancer    Prior to Admission medications   Medication Sig Start Date End Date Taking? Authorizing Provider  acetaminophen (TYLENOL) 500 MG tablet Take 2 tablets (1,000 mg total) by mouth every 8 (eight) hours. 06/23/22 06/23/23 Yes Signa Kell, MD  promethazine-dextromethorphan (PROMETHAZINE-DM) 6.25-15 MG/5ML syrup Take 5 mLs by mouth every 4 (four) hours as needed. 10/29/22  Yes [provider]  ondansetron (ZOFRAN-ODT) 4 MG disintegrating tablet Take 1 tablet (4 mg total) by mouth every 8 (eight) hours as needed for nausea or vomiting. Patient not taking: Reported on 10/30/2022 06/23/22   Signa Kell, MD  oxyCODONE (ROXICODONE) 5 MG immediate release tablet Take 1-2 tablets (5-10 mg total) by mouth every 4 (four) hours as needed (pain). Patient not taking: Reported on 10/30/2022 06/23/22 06/23/23  Signa Kell, MD    Physical Exam: Vitals:   10/30/22 1006 10/30/22 1121 10/30/22 1149 10/30/22 1329  BP:   (!) 143/59   Pulse:   100   Resp: (!) 32  (!) 34   Temp:  (!) 103.2 F (39.6 C)  99.9 F (37.7 C)  TempSrc:  Oral  Oral  SpO2:   95%   Weight:      Height:  Physical Exam Constitutional:      Appearance: He is normal weight.  HENT:     Head: Normocephalic and atraumatic.     Nose: Nose normal.  Eyes:     Pupils: Pupils are equal, round, and reactive to light.  Cardiovascular:     Rate and Rhythm: Normal rate and regular rhythm.  Pulmonary:     Effort: Pulmonary effort is normal.  Abdominal:     General: Bowel sounds are normal.  Musculoskeletal:        General: Normal range of motion.  Skin:    General: Skin is warm.  Neurological:     General: No focal deficit present.  Psychiatric:        Mood and Affect: Mood normal.     Data  Reviewed:  There are no new results to review at this time.  DG Chest 2 View CLINICAL DATA:  Cough and fever.  EXAM: CHEST - 2 VIEW  COMPARISON:  None Available.  FINDINGS: Right lower lobe airspace disease consistent with pneumonia. Minor atelectasis at the left lung base. The heart is normal in size. There may be a small right pleural effusion. No pulmonary edema. No pneumothorax. No acute osseous findings.  IMPRESSION: Right lower lobe airspace disease consistent with pneumonia. Possible small right pleural effusion.  Electronically Signed   By: Narda Rutherford M.D.   On: 10/30/2022 11:21  Lab Results  Component Value Date   WBC 8.2 10/30/2022   HGB 15.2 10/30/2022   HCT 44.9 10/30/2022   MCV 85.7 10/30/2022   PLT 204 10/30/2022   Last metabolic panel Lab Results  Component Value Date   GLUCOSE 104 (H) 10/30/2022   NA 135 10/30/2022   K 3.8 10/30/2022   CL 102 10/30/2022   CO2 22 10/30/2022   BUN 10 10/30/2022   CREATININE 1.21 10/30/2022   GFRNONAA >60 10/30/2022   CALCIUM 9.0 10/30/2022   PROT 8.0 10/30/2022   ALBUMIN 4.2 10/30/2022   BILITOT 0.9 10/30/2022   ALKPHOS 42 10/30/2022   AST 18 10/30/2022   ALT 15 10/30/2022   ANIONGAP 11 10/30/2022    Assessment and Plan: * Sepsis (HCC) Meeting sepsis criteria with Tmax of 103.2, heart rate 100s Noted right lower lobe pneumonia on chest x-ray COVID flu and RSV negative White count 8.2 Lactate 1.8. Procalcitonin pending Pancultured IV Rocephin azithromycin for infectious coverage Monitor   Pneumonia Increased work of breathing, shortness of breath with noted sepsis on presentation as well as right lower lobe pneumonia on chest x-ray IV Rocephin azithromycin for infectious coverage Blood and respiratory cultures Supplemental oxygen as needed Discussed vaping and occasional alcohol cessation Follow closely  Engages in vaping Regular vape use Discussed cessation Nicotine patch   Greater  than 50% was spent in counseling and coordination of care with patient Total encounter time 80 minutes or more     Advance Care Planning:   Code Status: Full Code   Consults: None   Family Communication: Wife at the bedside   Severity of Illness: The appropriate patient status for this patient is INPATIENT. Inpatient status is judged to be reasonable and necessary in order to provide the required intensity of service to ensure the patient's safety. The patient's presenting symptoms, physical exam findings, and initial radiographic and laboratory data in the context of their chronic comorbidities is felt to place them at high risk for further clinical deterioration. Furthermore, it is not anticipated that the patient will be medically stable for discharge from  the hospital within 2 midnights of admission.   * I certify that at the point of admission it is my clinical judgment that the patient will require inpatient hospital care spanning beyond 2 midnights from the point of admission due to high intensity of service, high risk for further deterioration and high frequency of surveillance required.*  Author: Floydene Flock, MD 10/30/2022 1:40 PM  For on call review [email or web address].

## 2022-10-30 NOTE — Assessment & Plan Note (Signed)
Increased work of breathing, shortness of breath with noted sepsis on presentation as well as right lower lobe pneumonia on chest x-ray IV Rocephin azithromycin for infectious coverage Blood and respiratory cultures Supplemental oxygen as needed Discussed vaping and occasional alcohol cessation Follow closely

## 2022-10-30 NOTE — Sepsis Progress Note (Signed)
Code Sepsis protocol being monitored by [employer]. 

## 2022-10-31 DIAGNOSIS — A419 Sepsis, unspecified organism: Secondary | ICD-10-CM | POA: Diagnosis not present

## 2022-10-31 LAB — CBC
HCT: 39.2 % (ref 39.0–52.0)
Hemoglobin: 13.3 g/dL (ref 13.0–17.0)
MCH: 29.4 pg (ref 26.0–34.0)
MCHC: 33.9 g/dL (ref 30.0–36.0)
MCV: 86.7 fL (ref 80.0–100.0)
Platelets: 180 10*3/uL (ref 150–400)
RBC: 4.52 MIL/uL (ref 4.22–5.81)
RDW: 12.2 % (ref 11.5–15.5)
WBC: 5 10*3/uL (ref 4.0–10.5)
nRBC: 0 % (ref 0.0–0.2)

## 2022-10-31 LAB — COMPREHENSIVE METABOLIC PANEL
ALT: 12 U/L (ref 0–44)
AST: 12 U/L — ABNORMAL LOW (ref 15–41)
Albumin: 3.3 g/dL — ABNORMAL LOW (ref 3.5–5.0)
Alkaline Phosphatase: 33 U/L — ABNORMAL LOW (ref 38–126)
Anion gap: 8 (ref 5–15)
BUN: 9 mg/dL (ref 6–20)
CO2: 23 mmol/L (ref 22–32)
Calcium: 8.5 mg/dL — ABNORMAL LOW (ref 8.9–10.3)
Chloride: 105 mmol/L (ref 98–111)
Creatinine, Ser: 0.92 mg/dL (ref 0.61–1.24)
GFR, Estimated: >60 mL/min (ref >60)
Glucose, Bld: 99 mg/dL (ref 70–99)
Potassium: 3.7 mmol/L (ref 3.5–5.1)
Sodium: 136 mmol/L (ref 135–145)
Total Bilirubin: 0.6 mg/dL (ref 0.3–1.2)
Total Protein: 6.5 g/dL (ref 6.5–8.1)

## 2022-10-31 MED ORDER — GUAIFENESIN-DM 100-10 MG/5ML PO SYRP
5.0000 mL | ORAL_SOLUTION | ORAL | Status: DC | PRN
  Administered 2022-10-31: 5 mL via ORAL
  Filled 2022-10-31: qty 10

## 2022-10-31 NOTE — Progress Notes (Addendum)
Progress Note    Anes Rigel  CZY:[id number] DOB: January 20, 1995  DOA: 10/30/2022 PCP: Sherlene Shams, MD      Brief Narrative:    Medical records reviewed and are as summarized below:  Alexander Gallagher is a 27 y.o. male with medical history significant for labral tear of left shoulder, right ankle fracture, who presented to the hospital with productive cough, pleuritic chest pain and fever.  He was found to have sepsis secondary to right lower lobe pneumonia.     Assessment/Plan:   Principal Problem:   Sepsis (HCC) Active Problems:   Pneumonia   Engages in vaping   Body mass index is 29.84 kg/m.   Extensive secondary to right lower lobe pneumonia: Continue IV ceftriaxone and Zosyn.  Influenza, COVID-19 and admitted tests were negative.  Respiratory viral panel was negative.  Strep pneumo antigen was negative.  Blood culture and Legionella urine antigen are pending.   Use of electronic cigarette: Counseled to quit.  Diet Order             Diet regular Room service appropriate? Yes; Fluid consistency: Thin  Diet effective now                            Consultants: None  Procedures: None    Medications:    nicotine  7 mg Transdermal Daily   Continuous Infusions:  azithromycin 500 mg (10/31/22 1008)   cefTRIAXone (ROCEPHIN)  IV 2 g (10/31/22 0920)     Anti-infectives (From admission, onward)    Start     Dose/Rate Route Frequency Ordered Stop   10/31/22 1000  cefTRIAXone (ROCEPHIN) 2 g in sodium chloride 0.9 % 100 mL IVPB        2 g 200 mL/hr over 30 Minutes Intravenous Every 24 hours 10/30/22 1240 11/05/22 0959   10/31/22 1000  azithromycin (ZITHROMAX) 500 mg in sodium chloride 0.9 % 250 mL IVPB        500 mg 250 mL/hr over 60 Minutes Intravenous Every 24 hours 10/30/22 1240 11/05/22 0959   10/30/22 1100  cefTRIAXone (ROCEPHIN) 2 g in sodium chloride 0.9 % 100 mL IVPB  Status:  Discontinued        2 g 200 mL/hr over 30  Minutes Intravenous Every 24 hours 10/30/22 1058 10/30/22 1240   10/30/22 1100  azithromycin (ZITHROMAX) 500 mg in sodium chloride 0.9 % 250 mL IVPB  Status:  Discontinued        500 mg 250 mL/hr over 60 Minutes Intravenous Every 24 hours 10/30/22 1058 10/30/22 1240              Family Communication/Anticipated D/C date and plan/Code Status   DVT prophylaxis: SCDs Start: 10/30/22 1323     Code Status: Full Code  Family Communication: None Disposition Plan: Plan to discharge home tomorrow   Status is: Inpatient Remains inpatient appropriate because: On IV antibiotics for sepsis from pneumonia       Subjective:   He complains of cough productive of "cloudy" phlegm  Objective:    Vitals:   10/30/22 2324 10/31/22 0316 10/31/22 0801 10/31/22 1110  BP: 116/65 106/61 128/73 120/70  Pulse: 98 (!) 58 72 81  Resp: 18 18 (!) 21 (!) 21  Temp: 98.3 F (36.8 C) 98 F (36.7 C) 99.5 F (37.5 C) 98.5 F (36.9 C)  TempSrc:  Oral  Oral  SpO2: 95% 99% 98% 98%  Weight:  Height:       No data found.   Intake/Output Summary (Last 24 hours) at 10/31/2022 1123 Last data filed at 10/31/2022 1043 Gross per 24 hour  Intake 2675.35 ml  Output 900 ml  Net 1775.35 ml   Filed Weights   10/30/22 0958  Weight: 94.3 kg    Exam:  GEN: NAD SKIN: Warm and dry EYES: No pallor or icterus ENT: MMM CV: RRR PULM: Rhonchi at the right lung base.  No rales or wheezing heard. ABD: soft, ND, NT, +BS CNS: AAO x 3, non focal EXT: No edema or tenderness        Data Reviewed:   I have personally reviewed following labs and imaging studies:  Labs: Labs show the following:   Basic Metabolic Panel: Recent Labs  Lab 10/30/22 1012 10/30/22 1105 10/31/22 0459  NA 136 135 136  K 3.7 3.8 3.7  CL 103 102 105  CO2 23 22 23   GLUCOSE 112* 104* 99  BUN 10 10 9   CREATININE 1.22 1.21 0.92  CALCIUM 9.0 9.0 8.5*   GFR Estimated Creatinine Clearance: 141.5 mL/min (by C-G  formula based on SCr of 0.92 mg/dL). Liver Function Tests: Recent Labs  Lab 10/30/22 1105 10/31/22 0459  AST 18 12*  ALT 15 12  ALKPHOS 42 33*  BILITOT 0.9 0.6  PROT 8.0 6.5  ALBUMIN 4.2 3.3*   No results for input(s): "LIPASE", "AMYLASE" in the last 168 hours. No results for input(s): "AMMONIA" in the last 168 hours. Coagulation profile Recent Labs  Lab 10/30/22 1105  INR 1.3*    CBC: Recent Labs  Lab 10/30/22 1012 10/31/22 0459  WBC 8.2 5.0  HGB 15.2 13.3  HCT 44.9 39.2  MCV 85.7 86.7  PLT 204 180   Cardiac Enzymes: No results for input(s): "CKTOTAL", "CKMB", "CKMBINDEX", "TROPONINI" in the last 168 hours. BNP (last 3 results) No results for input(s): "PROBNP" in the last 8760 hours. CBG: No results for input(s): "GLUCAP" in the last 168 hours. D-Dimer: No results for input(s): "DDIMER" in the last 72 hours. Hgb A1c: No results for input(s): "HGBA1C" in the last 72 hours. Lipid Profile: No results for input(s): "CHOL", "HDL", "LDLCALC", "TRIG", "CHOLHDL", "LDLDIRECT" in the last 72 hours. Thyroid function studies: No results for input(s): "TSH", "T4TOTAL", "T3FREE", "THYROIDAB" in the last 72 hours.  Invalid input(s): "FREET3" Anemia work up: No results for input(s): "VITAMINB12", "FOLATE", "FERRITIN", "TIBC", "IRON", "RETICCTPCT" in the last 72 hours. Sepsis Labs: Recent Labs  Lab 10/30/22 1012 10/30/22 1105 10/30/22 1330 10/31/22 0459  PROCALCITON  --   --  0.50  --   WBC 8.2  --   --  5.0  LATICACIDVEN  --  1.8 1.0  --     Microbiology Recent Results (from the past 240 hour(s))  Blood Culture (routine x 2)     Status: None (Preliminary result)   Collection Time: 10/30/22 11:05 AM   Specimen: BLOOD  Result Value Ref Range Status   Specimen Description BLOOD RIGHT ANTECUBITAL  Final   Special Requests   Final    BOTTLES DRAWN AEROBIC AND ANAEROBIC Blood Culture adequate volume   Culture   Final    NO GROWTH < 24 HOURS Performed at [facility], [address] [place] [address] [place], [place] [address]    Report Status PENDING  Incomplete  Expectorated Sputum Assessment w Gram Stain, Rflx to Resp Cult     Status: None   Collection Time: 10/30/22 11:05 AM  Specimen: Expectorated Sputum  Result Value Ref Range Status   Specimen Description EXPECTORATED SPUTUM  Final   Special Requests NONE  Final   Sputum evaluation   Final    THIS SPECIMEN IS ACCEPTABLE FOR SPUTUM CULTURE Performed at [facility], [address], [place], [place] [address]    Report Status 10/30/2022 FINAL  Final  Culture, Respiratory w Gram Stain     Status: None (Preliminary result)   Collection Time: 10/30/22 11:05 AM  Result Value Ref Range Status   Specimen Description   Final    EXPECTORATED SPUTUM Performed at [facility], [address], [place], [place] [address]    Special Requests   Final    NONE Reflexed from [id number] Performed at [facility] [address] [place], [place] [address]    Gram Stain   Final    ABUNDANT WBC PRESENT, PREDOMINANTLY PMN MODERATE GRAM POSITIVE COCCI MODERATE GRAM NEGATIVE RODS Performed at [facility], [address] [place], [place] [address]    Culture PENDING  Incomplete   Report Status PENDING  Incomplete  Resp panel by RT-PCR (RSV, Flu A&B, Covid) Anterior Nasal Swab     Status: None   Collection Time: 10/30/22 11:52 AM   Specimen: Anterior Nasal Swab  Result Value Ref Range Status   SARS Coronavirus 2 by RT PCR NEGATIVE NEGATIVE Final    Comment: (NOTE) SARS-CoV-2 target nucleic acids are NOT DETECTED.  The SARS-CoV-2 RNA is generally detectable in upper respiratory specimens during the acute phase of infection. The lowest concentration of SARS-CoV-2 viral copies this assay can detect is 138 copies/mL. A negative result does not preclude SARS-Cov-2 infection and should not be used as the sole basis for treatment or other patient management decisions. A negative  result may occur with  improper specimen collection/handling, submission of specimen other than nasopharyngeal swab, presence of viral mutation(s) within the areas targeted by this assay, and inadequate number of viral copies(<138 copies/mL). A negative result must be combined with clinical observations, patient history, and epidemiological information. The expected result is Negative.  Fact Sheet for Patients:  BloggerCourse.com  Fact Sheet for Healthcare Providers:  SeriousBroker.it  This test is no t yet approved or cleared by the [place] FDA and  has been authorized for detection and/or diagnosis of SARS-CoV-2 by FDA under an Emergency Use Authorization (EUA). This EUA will remain  in effect (meaning this test can be used) for the duration of the COVID-19 declaration under Section 564(b)(1) of the Act, 21 U.S.C.section 360bbb-3(b)(1), unless the authorization is terminated  or revoked sooner.       Influenza A by PCR NEGATIVE NEGATIVE Final   Influenza B by PCR NEGATIVE NEGATIVE Final    Comment: (NOTE) The Xpert Xpress SARS-CoV-2/FLU/RSV plus assay is intended as an aid in the diagnosis of influenza from Nasopharyngeal swab specimens and should not be used as a sole basis for treatment. Nasal washings and aspirates are unacceptable for Xpert Xpress SARS-CoV-2/FLU/RSV testing.  Fact Sheet for Patients: BloggerCourse.com  Fact Sheet for Healthcare Providers: SeriousBroker.it  This test is not yet approved or cleared by the [place] FDA and has been authorized for detection and/or diagnosis of SARS-CoV-2 by FDA under an Emergency Use Authorization (EUA). This EUA will remain in effect (meaning this test can be used) for the duration of the COVID-19 declaration under Section 564(b)(1) of the Act, 21 U.S.C. section 360bbb-3(b)(1), unless the authorization is  terminated or revoked.  Resp Syncytial Virus by PCR NEGATIVE NEGATIVE Final    Comment: (NOTE) Fact Sheet for Patients: BloggerCourse.com  Fact Sheet for Healthcare Providers: SeriousBroker.it  This test is not yet approved or cleared by the [place] FDA and has been authorized for detection and/or diagnosis of SARS-CoV-2 by FDA under an Emergency Use Authorization (EUA). This EUA will remain in effect (meaning this test can be used) for the duration of the COVID-19 declaration under Section 564(b)(1) of the Act, 21 U.S.C. section 360bbb-3(b)(1), unless the authorization is terminated or revoked.  Performed at [facility], [address] [place], [place] [address]   Blood Culture (routine x 2)     Status: None (Preliminary result)   Collection Time: 10/30/22 12:19 PM   Specimen: BLOOD  Result Value Ref Range Status   Specimen Description BLOOD RIGTH HAND  Final   Special Requests   Final    BOTTLES DRAWN AEROBIC AND ANAEROBIC Blood Culture results may not be optimal due to an excessive volume of blood received in culture bottles   Culture   Final    NO GROWTH < 24 HOURS Performed at [facility], [address] [place], [place] [address]    Report Status PENDING  Incomplete  Respiratory (~20 pathogens) panel by PCR     Status: None   Collection Time: 10/30/22  2:44 PM   Specimen: Nasopharyngeal Swab; Respiratory  Result Value Ref Range Status   Adenovirus NOT DETECTED NOT DETECTED Final   Coronavirus 229E NOT DETECTED NOT DETECTED Final    Comment: (NOTE) The Coronavirus on the Respiratory Panel, DOES NOT test for the novel  Coronavirus (2019 nCoV)    Coronavirus HKU1 NOT DETECTED NOT DETECTED Final   Coronavirus NL63 NOT DETECTED NOT DETECTED Final   Coronavirus OC43 NOT DETECTED NOT DETECTED Final   Metapneumovirus NOT DETECTED NOT DETECTED Final   Rhinovirus / Enterovirus NOT DETECTED  NOT DETECTED Final   Influenza A NOT DETECTED NOT DETECTED Final   Influenza B NOT DETECTED NOT DETECTED Final   Parainfluenza Virus 1 NOT DETECTED NOT DETECTED Final   Parainfluenza Virus 2 NOT DETECTED NOT DETECTED Final   Parainfluenza Virus 3 NOT DETECTED NOT DETECTED Final   Parainfluenza Virus 4 NOT DETECTED NOT DETECTED Final   Respiratory Syncytial Virus NOT DETECTED NOT DETECTED Final   Bordetella pertussis NOT DETECTED NOT DETECTED Final   Bordetella Parapertussis NOT DETECTED NOT DETECTED Final   Chlamydophila pneumoniae NOT DETECTED NOT DETECTED Final   Mycoplasma pneumoniae NOT DETECTED NOT DETECTED Final    Comment: Performed at [facility], [address], [place], [place] [address]    Procedures and diagnostic studies:  DG Chest 2 View  Result Date: 10/30/2022 CLINICAL DATA:  Cough and fever. EXAM: CHEST - 2 VIEW COMPARISON:  None Available. FINDINGS: Right lower lobe airspace disease consistent with pneumonia. Minor atelectasis at the left lung base. The heart is normal in size. There may be a small right pleural effusion. No pulmonary edema. No pneumothorax. No acute osseous findings. IMPRESSION: Right lower lobe airspace disease consistent with pneumonia. Possible small right pleural effusion. Electronically Signed   By: Narda Rutherford M.D.   On: 10/30/2022 11:21               LOS: 1 day   Lorian Yaun  [facility]   Pager on [email or web address]. If 7PM-7AM, please contact night-coverage at [email or web address]     10/31/2022, 11:23 AM

## 2022-10-31 NOTE — Plan of Care (Signed)

## 2022-10-31 NOTE — Progress Notes (Signed)
Transition of Care [facility]) - Inpatient Brief Assessment   Patient Details  Name: Alexander Gallagher MRN: [id number] Date of Birth: [birth year]  Transition of Care [facility]) CM/SW Contact:    Truddie Hidden, RN Phone Number: 10/31/2022, 2:58 PM   Clinical Narrative:  TOC continuing to follow patient's progress throughout discharge planning.   Transition of Care Asessment: Insurance and Status: Insurance coverage has been reviewed Patient has primary care physician: Yes Home environment has been reviewed: home Prior level of function:: independent Prior/Current Home Services: No current home services Social Determinants of Health Reivew: SDOH reviewed no interventions necessary Readmission risk has been reviewed: Yes Transition of care needs: no transition of care needs at this time

## 2022-11-01 DIAGNOSIS — R0603 Acute respiratory distress: Secondary | ICD-10-CM | POA: Insufficient documentation

## 2022-11-01 DIAGNOSIS — A419 Sepsis, unspecified organism: Secondary | ICD-10-CM | POA: Diagnosis not present

## 2022-11-01 DIAGNOSIS — J189 Pneumonia, unspecified organism: Secondary | ICD-10-CM | POA: Diagnosis not present

## 2022-11-01 DIAGNOSIS — Z7289 Other problems related to lifestyle: Secondary | ICD-10-CM | POA: Diagnosis not present

## 2022-11-01 LAB — CULTURE, RESPIRATORY W GRAM STAIN

## 2022-11-01 LAB — LEGIONELLA PNEUMOPHILA SEROGP 1 UR AG: L. pneumophila Serogp 1 Ur Ag: NEGATIVE

## 2022-11-01 MED ORDER — AMOXICILLIN-POT CLAVULANATE 500-125 MG PO TABS: 1.0000 | ORAL_TABLET | Freq: Three times a day (TID) | ORAL | 0 refills | Status: AC

## 2022-11-01 MED ORDER — GUAIFENESIN-DM 100-10 MG/5ML PO SYRP: 5.0000 mL | ORAL_SOLUTION | ORAL | 0 refills | Status: AC | PRN

## 2022-11-01 NOTE — Plan of Care (Signed)

## 2022-11-01 NOTE — Plan of Care (Signed)
  Problem: Fluid Volume: Goal: Hemodynamic stability will improve 11/01/2022 1047 by Zeddie Njie P, RN Outcome: Progressing 11/01/2022 1007 by Nadine Ryle P, RN Outcome: Progressing   Problem: Clinical Measurements: Goal: Diagnostic test results will improve 11/01/2022 1047 by Dajanee Voorheis P, RN Outcome: Progressing 11/01/2022 1007 by Avanti Jetter P, RN Outcome: Progressing Goal: Signs and symptoms of infection will decrease 11/01/2022 1047 by Micalah Cabezas P, RN Outcome: Progressing 11/01/2022 1007 by Donis Kotowski P, RN Outcome: Progressing   Problem: Respiratory: Goal: Ability to maintain adequate ventilation will improve 11/01/2022 1047 by Kasiyah Platter P, RN Outcome: Progressing 11/01/2022 1007 by Havoc Sanluis P, RN Outcome: Progressing   Problem: Education: Goal: Knowledge of General Education information will improve Description: Including pain rating scale, medication(s)/side effects and non-pharmacologic comfort measures 11/01/2022 1047 by Quintella Mura P, RN Outcome: Progressing 11/01/2022 1007 by Athleen Feltner P, RN Outcome: Progressing   Problem: Health Behavior/Discharge Planning: Goal: Ability to manage health-related needs will improve 11/01/2022 1047 by Amity Roes P, RN Outcome: Progressing 11/01/2022 1007 by Tondalaya Perren P, RN Outcome: Progressing   Problem: Clinical Measurements: Goal: Ability to maintain clinical measurements within normal limits will improve 11/01/2022 1047 by Shantal Roan P, RN Outcome: Progressing 11/01/2022 1007 by Odie Rauen P, RN Outcome: Progressing Goal: Will remain free from infection 11/01/2022 1047 by Austine Kelsay P, RN Outcome: Progressing 11/01/2022 1007 by Gibran Veselka P, RN Outcome: Progressing Goal: Diagnostic test results will improve 11/01/2022 1047 by Chace Bisch P, RN Outcome: Progressing 11/01/2022 1007 by Cosmo Tetreault P,  RN Outcome: Progressing Goal: Respiratory complications will improve 11/01/2022 1047 by Jozy Mcphearson P, RN Outcome: Progressing 11/01/2022 1007 by Alece Koppel P, RN Outcome: Progressing Goal: Cardiovascular complication will be avoided 11/01/2022 1047 by Izabela Ow P, RN Outcome: Progressing 11/01/2022 1007 by Wednesday Ericsson P, RN Outcome: Progressing   Problem: Activity: Goal: Risk for activity intolerance will decrease 11/01/2022 1047 by Colm Lyford P, RN Outcome: Progressing 11/01/2022 1007 by Gordon Vandunk P, RN Outcome: Progressing   Problem: Nutrition: Goal: Adequate nutrition will be maintained 11/01/2022 1047 by Yuta Cipollone P, RN Outcome: Progressing 11/01/2022 1007 by Marios Gaiser P, RN Outcome: Progressing   Problem: Coping: Goal: Level of anxiety will decrease 11/01/2022 1047 by Syla Devoss P, RN Outcome: Progressing 11/01/2022 1007 by Deserai Cansler P, RN Outcome: Progressing   Problem: Elimination: Goal: Will not experience complications related to bowel motility 11/01/2022 1047 by Rajah Tagliaferro P, RN Outcome: Progressing 11/01/2022 1007 by Ellianah Cordy P, RN Outcome: Progressing Goal: Will not experience complications related to urinary retention 11/01/2022 1047 by Alaynah Schutter P, RN Outcome: Progressing 11/01/2022 1007 by Jorel Gravlin P, RN Outcome: Progressing   Problem: Pain Managment: Goal: General experience of comfort will improve 11/01/2022 1047 by Posey Jasmin P, RN Outcome: Progressing 11/01/2022 1007 by Wilmon Conover P, RN Outcome: Progressing   Problem: Safety: Goal: Ability to remain free from injury will improve 11/01/2022 1047 by Annajulia Lewing P, RN Outcome: Progressing 11/01/2022 1007 by Deaunte Dente P, RN Outcome: Progressing   Problem: Skin Integrity: Goal: Risk for impaired skin integrity will decrease 11/01/2022 1047 by Kaleah Hagemeister P, RN Outcome:  Progressing 11/01/2022 1007 by Jamon Hayhurst P, RN Outcome: Progressing

## 2022-11-01 NOTE — Discharge Summary (Signed)
Physician Discharge Summary   Patient: Alexander Gallagher MRN: [id number] DOB: 10/16/1995  Admit date:     10/30/2022  Discharge date: 11/01/22  Discharge Physician: Marcelino Duster   PCP: Sherlene Shams, MD   Recommendations at discharge:    PCP follow up in 1 week.  Discharge Diagnoses: Principal Problem:   Sepsis (HCC) Active Problems:   Pneumonia   Engages in vaping   Respiratory distress  Resolved Problems:   * No resolved hospital problems. *  Hospital Course: Alexander Gallagher is a 26 y.o. male with medical history significant of no significant prior medical issues presenting with sepsis and pneumonia.  Patient reports increased work of breathing, cough and shortness of breath since roughly 4 to 5 days ago.  No recent sick contacts though does report taking care of his son who had strep throat roughly 4 to 6 weeks ago.  No abdominal pain.  Mild nausea.  No focal hemiparesis or confusion.  Denies tobacco use but does vape on a regular basis.  Occasional beer intake especially on the weekends.  Denies any prior episodes like this in the past. Presented to the ER Tmax of 103.2, heart rate 100s, respirations in the 30s, BP stable, satting in the mid 90s on 2 L nasal cannula.  White count 8.2, hemoglobin 15, platelets 204, COVID flu and RSV negative, creatinine 1.21, chest x-ray with right lower lobe pneumonia.  Assessment and Plan: * Sepsis (HCC) Presented with Tmax of 103.2, heart rate 100s. Noted right lower lobe pneumonia on chest x-ray COVID flu and RSV negative. White count 8.2 Lactate 1.8. Procalcitonin 0.5 Blood cultures no growth. Rocephin, azithromycin changed to oral Augmentin for 5 more days. Advised to follow-up with PCP upon discharge. Patient is off supplemental oxygen, able to ambulate.  Pneumonia Patient's shortness of breath much improved. Blood, respiratory cultures unremarkable IV Rocephin azithromycin changed to Augmentin. He is off supplemental  oxygen, Encouraged incentive spirometry. Discussed vaping and occasional alcohol cessation PCP follow-up suggested.  Engages in vaping Discussed cessation, he understands and agrees He refused nicotine patch upon discharge      Consultants: None Procedures performed: None Disposition: Home Diet recommendation:  Discharge Diet Orders (From admission, onward)     Start     Ordered   11/01/22 0000  Diet - low sodium heart healthy        11/01/22 1018           Regular diet DISCHARGE MEDICATION: Allergies as of 11/01/2022   No Known Allergies      Medication List     STOP taking these medications    ondansetron 4 MG disintegrating tablet Commonly known as: ZOFRAN-ODT   oxyCODONE 5 MG immediate release tablet Commonly known as: Roxicodone   promethazine-dextromethorphan 6.25-15 MG/5ML syrup Commonly known as: PROMETHAZINE-DM       TAKE these medications    acetaminophen 500 MG tablet Commonly known as: TYLENOL Take 2 tablets (1,000 mg total) by mouth every 8 (eight) hours.   amoxicillin-clavulanate 500-125 MG tablet Commonly known as: Augmentin Take 1 tablet by mouth 3 (three) times daily for 5 days.   guaiFENesin-dextromethorphan 100-10 MG/5ML syrup Commonly known as: ROBITUSSIN DM Take 5 mLs by mouth every 4 (four) hours as needed for cough.        Follow-up Information     Sherlene Shams, MD Follow up in 1 week(s).   Specialty: Internal Medicine Contact information: [address] [place] [address] [phone]  Discharge Exam: Filed Weights   10/30/22 0958  Weight: 94.3 kg   General -  Young Caucasian male, no apparent distress HEENT - PERRLA, EOMI, atraumatic head, non tender sinuses. Lung - Clear, rales, rhonchi, wheezes. Heart - S1, S2 heard, no murmurs, rubs, no pedal edema Neuro - Alert, awake and oriented x 3, non focal exam. Skin - Warm and dry.  Condition at discharge:  stable  The results of significant diagnostics from this hospitalization (including imaging, microbiology, ancillary and laboratory) are listed below for reference.   Imaging Studies: DG Chest 2 View  Result Date: 10/30/2022 CLINICAL DATA:  Cough and fever. EXAM: CHEST - 2 VIEW COMPARISON:  None Available. FINDINGS: Right lower lobe airspace disease consistent with pneumonia. Minor atelectasis at the left lung base. The heart is normal in size. There may be a small right pleural effusion. No pulmonary edema. No pneumothorax. No acute osseous findings. IMPRESSION: Right lower lobe airspace disease consistent with pneumonia. Possible small right pleural effusion. Electronically Signed   By: Narda Rutherford M.D.   On: 10/30/2022 11:21    Microbiology: Results for orders placed or performed during the hospital encounter of 10/30/22  Blood Culture (routine x 2)     Status: None (Preliminary result)   Collection Time: 10/30/22 11:05 AM   Specimen: BLOOD  Result Value Ref Range Status   Specimen Description BLOOD RIGHT ANTECUBITAL  Final   Special Requests   Final    BOTTLES DRAWN AEROBIC AND ANAEROBIC Blood Culture adequate volume   Culture   Final    NO GROWTH 2 DAYS Performed at [facility], [address], [place], [place] [address]    Report Status PENDING  Incomplete  Expectorated Sputum Assessment w Gram Stain, Rflx to Resp Cult     Status: None   Collection Time: 10/30/22 11:05 AM   Specimen: Expectorated Sputum  Result Value Ref Range Status   Specimen Description EXPECTORATED SPUTUM  Final   Special Requests NONE  Final   Sputum evaluation   Final    THIS SPECIMEN IS ACCEPTABLE FOR SPUTUM CULTURE Performed at [facility], [address], [place], [place] [address]    Report Status 10/30/2022 FINAL  Final  Culture, Respiratory w Gram Stain     Status: None (Preliminary result)   Collection Time: 10/30/22 11:05 AM  Result Value Ref Range Status   Specimen  Description   Final    EXPECTORATED SPUTUM Performed at [facility], [address], [place], [place] [address]    Special Requests   Final    NONE Reflexed from [id number] Performed at [facility], [address] [place], [place] [address]    Gram Stain   Final    ABUNDANT WBC PRESENT, PREDOMINANTLY PMN MODERATE GRAM POSITIVE COCCI MODERATE GRAM NEGATIVE RODS    Culture   Final    TOO YOUNG TO READ Performed at [facility], [address], [place], [place] [address]    Report Status PENDING  Incomplete  Resp panel by RT-PCR (RSV, Flu A&B, Covid) Anterior Nasal Swab     Status: None   Collection Time: 10/30/22 11:52 AM   Specimen: Anterior Nasal Swab  Result Value Ref Range Status   SARS Coronavirus 2 by RT PCR NEGATIVE NEGATIVE Final    Comment: (NOTE) SARS-CoV-2 target nucleic acids are NOT DETECTED.  The SARS-CoV-2 RNA is generally detectable in upper respiratory specimens during the acute phase of infection. The lowest concentration of  SARS-CoV-2 viral copies this assay can detect is 138 copies/mL. A negative result does not preclude SARS-Cov-2 infection and should not be used as the sole basis for treatment or other patient management decisions. A negative result may occur with  improper specimen collection/handling, submission of specimen other than nasopharyngeal swab, presence of viral mutation(s) within the areas targeted by this assay, and inadequate number of viral copies(<138 copies/mL). A negative result must be combined with clinical observations, patient history, and epidemiological information. The expected result is Negative.  Fact Sheet for Patients:  BloggerCourse.com  Fact Sheet for Healthcare Providers:  SeriousBroker.it  This test is no t yet approved or cleared by the [place] FDA and  has been authorized for detection and/or diagnosis of SARS-CoV-2 by FDA under an  Emergency Use Authorization (EUA). This EUA will remain  in effect (meaning this test can be used) for the duration of the COVID-19 declaration under Section 564(b)(1) of the Act, 21 U.S.C.section 360bbb-3(b)(1), unless the authorization is terminated  or revoked sooner.       Influenza A by PCR NEGATIVE NEGATIVE Final   Influenza B by PCR NEGATIVE NEGATIVE Final    Comment: (NOTE) The Xpert Xpress SARS-CoV-2/FLU/RSV plus assay is intended as an aid in the diagnosis of influenza from Nasopharyngeal swab specimens and should not be used as a sole basis for treatment. Nasal washings and aspirates are unacceptable for Xpert Xpress SARS-CoV-2/FLU/RSV testing.  Fact Sheet for Patients: BloggerCourse.com  Fact Sheet for Healthcare Providers: SeriousBroker.it  This test is not yet approved or cleared by the [place] FDA and has been authorized for detection and/or diagnosis of SARS-CoV-2 by FDA under an Emergency Use Authorization (EUA). This EUA will remain in effect (meaning this test can be used) for the duration of the COVID-19 declaration under Section 564(b)(1) of the Act, 21 U.S.C. section 360bbb-3(b)(1), unless the authorization is terminated or revoked.     Resp Syncytial Virus by PCR NEGATIVE NEGATIVE Final    Comment: (NOTE) Fact Sheet for Patients: BloggerCourse.com  Fact Sheet for Healthcare Providers: SeriousBroker.it  This test is not yet approved or cleared by the [place] FDA and has been authorized for detection and/or diagnosis of SARS-CoV-2 by FDA under an Emergency Use Authorization (EUA). This EUA will remain in effect (meaning this test can be used) for the duration of the COVID-19 declaration under Section 564(b)(1) of the Act, 21 U.S.C. section 360bbb-3(b)(1), unless the authorization is terminated or revoked.  Performed at [facility], [address] [place], [place] [address]   Blood Culture (routine x 2)     Status: None (Preliminary result)   Collection Time: 10/30/22 12:19 PM   Specimen: BLOOD  Result Value Ref Range Status   Specimen Description BLOOD RIGTH HAND  Final   Special Requests   Final    BOTTLES DRAWN AEROBIC AND ANAEROBIC Blood Culture results may not be optimal due to an excessive volume of blood received in culture bottles   Culture   Final    NO GROWTH 2 DAYS Performed at [facility], [address], [place], [place] [address]    Report Status PENDING  Incomplete  Respiratory (~20 pathogens) panel by PCR     Status: None   Collection Time: 10/30/22  2:44 PM   Specimen: Nasopharyngeal Swab; Respiratory  Result Value Ref Range Status   Adenovirus NOT DETECTED NOT DETECTED Final   Coronavirus 229E NOT DETECTED NOT DETECTED Final    Comment: (NOTE) The  Coronavirus on the Respiratory Panel, DOES NOT test for the novel  Coronavirus (2019 nCoV)    Coronavirus HKU1 NOT DETECTED NOT DETECTED Final   Coronavirus NL63 NOT DETECTED NOT DETECTED Final   Coronavirus OC43 NOT DETECTED NOT DETECTED Final   Metapneumovirus NOT DETECTED NOT DETECTED Final   Rhinovirus / Enterovirus NOT DETECTED NOT DETECTED Final   Influenza A NOT DETECTED NOT DETECTED Final   Influenza B NOT DETECTED NOT DETECTED Final   Parainfluenza Virus 1 NOT DETECTED NOT DETECTED Final   Parainfluenza Virus 2 NOT DETECTED NOT DETECTED Final   Parainfluenza Virus 3 NOT DETECTED NOT DETECTED Final   Parainfluenza Virus 4 NOT DETECTED NOT DETECTED Final   Respiratory Syncytial Virus NOT DETECTED NOT DETECTED Final   Bordetella pertussis NOT DETECTED NOT DETECTED Final   Bordetella Parapertussis NOT DETECTED NOT DETECTED Final   Chlamydophila pneumoniae NOT DETECTED NOT DETECTED Final   Mycoplasma pneumoniae NOT DETECTED NOT DETECTED Final    Comment: Performed at [facility], [address], [place],  [place] [address]    Labs: CBC: Recent Labs  Lab 10/30/22 1012 10/31/22 0459  WBC 8.2 5.0  HGB 15.2 13.3  HCT 44.9 39.2  MCV 85.7 86.7  PLT 204 180   Basic Metabolic Panel: Recent Labs  Lab 10/30/22 1012 10/30/22 1105 10/31/22 0459  NA 136 135 136  K 3.7 3.8 3.7  CL 103 102 105  CO2 23 22 23   GLUCOSE 112* 104* 99  BUN 10 10 9   CREATININE 1.22 1.21 0.92  CALCIUM 9.0 9.0 8.5*   Liver Function Tests: Recent Labs  Lab 10/30/22 1105 10/31/22 0459  AST 18 12*  ALT 15 12  ALKPHOS 42 33*  BILITOT 0.9 0.6  PROT 8.0 6.5  ALBUMIN 4.2 3.3*   CBG: No results for input(s): "GLUCAP" in the last 168 hours.  Discharge time spent: 35 minutes.  Signed: Marcelino Duster, MD [facility] 11/01/2022

## 2022-11-04 LAB — CULTURE, BLOOD (ROUTINE X 2)
Culture: NO GROWTH
Culture: NO GROWTH
Special Requests: ADEQUATE

## 2022-11-08 NOTE — Plan of Care (Signed)
[facility] Tonsillectomy/Adenoidectomy, Postoperative PEDS care plan entered in error.
